# Patient Record
Sex: Male | Born: 1970 | Race: Black or African American | Hispanic: Yes | Marital: Single | Smoking: Current every day smoker
Health system: Southern US, Academic
[De-identification: ages and names within clinical notes are randomized; demographics above are authoritative.]

## PROBLEM LIST (undated history)

## (undated) DIAGNOSIS — I10 Essential (primary) hypertension: Secondary | ICD-10-CM

---

## 2014-04-19 ENCOUNTER — Emergency Department (HOSPITAL_BASED_OUTPATIENT_CLINIC_OR_DEPARTMENT_OTHER)
Admission: EM | Admit: 2014-04-19 | Discharge: 2014-04-20 | Disposition: A | Discharge status: Home / Self Care | Payer: Self-pay | Attending: Emergency Medicine | Admitting: Emergency Medicine

## 2014-04-19 ENCOUNTER — Emergency Department (HOSPITAL_BASED_OUTPATIENT_CLINIC_OR_DEPARTMENT_OTHER): Payer: Self-pay

## 2014-04-19 ENCOUNTER — Encounter (HOSPITAL_BASED_OUTPATIENT_CLINIC_OR_DEPARTMENT_OTHER): Payer: Self-pay

## 2014-04-19 DIAGNOSIS — M545 Low back pain: Secondary | ICD-10-CM | POA: Insufficient documentation

## 2014-04-19 DIAGNOSIS — M5136 Other intervertebral disc degeneration, lumbar region: Secondary | ICD-10-CM | POA: Insufficient documentation

## 2014-04-19 DIAGNOSIS — F17200 Nicotine dependence, unspecified, uncomplicated: Secondary | ICD-10-CM | POA: Insufficient documentation

## 2014-04-19 HISTORY — DX: Essential (primary) hypertension: I10

## 2014-04-19 MED ORDER — DICLOFENAC SODIUM 50 MG TABLET,DELAYED RELEASE
50.00 mg | DELAYED_RELEASE_TABLET | Freq: Two times a day (BID) | ORAL | Status: AC
Start: 2014-04-19 — End: ?

## 2014-04-19 MED ORDER — DIAZEPAM 5 MG TABLET
5.00 mg | ORAL_TABLET | Freq: Four times a day (QID) | ORAL | Status: AC | PRN
Start: 2014-04-19 — End: ?

## 2014-04-19 MED ORDER — KETOROLAC 30 MG/ML (1 ML) INJECTION SOLUTION
30.00 mg | INTRAMUSCULAR | Status: AC
Start: 2014-04-20 — End: 2014-04-19
  Administered 2014-04-19: 30 mg via INTRAVENOUS
  Filled 2014-04-19: qty 1

## 2014-04-19 MED ORDER — DIAZEPAM 5 MG/ML INJECTION SYRINGE
5.00 mg | INJECTION | INTRAMUSCULAR | Status: AC
Start: 2014-04-20 — End: 2014-04-19
  Administered 2014-04-19: 5 mg via INTRAVENOUS
  Filled 2014-04-19: qty 2

## 2014-04-19 MED ADMIN — calcium gluconate 100 mg/mL (10 %) intravenous solution: INTRAVENOUS | NDC 63323031110

## 2014-04-19 NOTE — ED Nurses Note (Signed)
Report given to Kelly RN by Kurt Azimi RN
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
## 2014-04-19 NOTE — ED Nurses Note (Signed)
CT scanner cannot take patient at this time.
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
## 2014-04-20 ENCOUNTER — Emergency Department (HOSPITAL_BASED_OUTPATIENT_CLINIC_OR_DEPARTMENT_OTHER)
Admission: EM | Admit: 2014-04-20 | Discharge: 2014-04-20 | Disposition: A | Discharge status: Home / Self Care | Payer: Self-pay | Attending: EXTERNAL | Admitting: EXTERNAL

## 2014-04-20 ENCOUNTER — Encounter (HOSPITAL_BASED_OUTPATIENT_CLINIC_OR_DEPARTMENT_OTHER): Payer: Self-pay

## 2014-04-20 DIAGNOSIS — Z760 Encounter for issue of repeat prescription: Secondary | ICD-10-CM | POA: Insufficient documentation

## 2014-04-20 MED ORDER — DIAZEPAM 5 MG TABLET
5.00 mg | ORAL_TABLET | ORAL | Status: AC
Start: 2014-04-20 — End: 2014-04-20
  Administered 2014-04-20: 5 mg via ORAL
  Filled 2014-04-20: qty 1

## 2014-04-20 MED ORDER — DICLOFENAC SODIUM 50 MG TABLET,DELAYED RELEASE
50.0000 mg | DELAYED_RELEASE_TABLET | Freq: Once | ORAL | Status: AC
Start: 2014-04-20 — End: 2014-04-20
  Administered 2014-04-20: 50 mg via ORAL
  Filled 2014-04-20: qty 1

## 2014-04-20 MED ORDER — DICLOFENAC SODIUM 75 MG TABLET,DELAYED RELEASE
75.0000 mg | DELAYED_RELEASE_TABLET | Freq: Once | ORAL | Status: DC
Start: 2014-04-20 — End: 2014-04-20
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
## 2017-07-04 ENCOUNTER — Other Ambulatory Visit: Payer: Self-pay

## 2017-07-04 ENCOUNTER — Emergency Department: Payer: Self-pay

## 2017-07-04 ENCOUNTER — Inpatient Hospital Stay
Admission: EM | Admit: 2017-07-04 | Discharge: 2017-07-05 | DRG: 287 | Disposition: A | Discharge status: Home / Self Care | Payer: Self-pay | Attending: Internal Medicine | Admitting: Internal Medicine

## 2017-07-04 DIAGNOSIS — I249 Acute ischemic heart disease, unspecified: Secondary | ICD-10-CM

## 2017-07-04 DIAGNOSIS — Z79899 Other long term (current) drug therapy: Secondary | ICD-10-CM

## 2017-07-04 DIAGNOSIS — E785 Hyperlipidemia, unspecified: Secondary | ICD-10-CM | POA: Diagnosis present

## 2017-07-04 DIAGNOSIS — I200 Unstable angina: Secondary | ICD-10-CM | POA: Diagnosis present

## 2017-07-04 DIAGNOSIS — I10 Essential (primary) hypertension: Secondary | ICD-10-CM | POA: Diagnosis present

## 2017-07-04 DIAGNOSIS — F17210 Nicotine dependence, cigarettes, uncomplicated: Secondary | ICD-10-CM | POA: Diagnosis present

## 2017-07-04 DIAGNOSIS — I700 Atherosclerosis of aorta: Secondary | ICD-10-CM | POA: Diagnosis present

## 2017-07-04 DIAGNOSIS — Z8249 Family history of ischemic heart disease and other diseases of the circulatory system: Secondary | ICD-10-CM

## 2017-07-04 DIAGNOSIS — I25110 Atherosclerotic heart disease of native coronary artery with unstable angina pectoris: Principal | ICD-10-CM | POA: Diagnosis present

## 2017-07-04 HISTORY — DX: Essential (primary) hypertension: I10

## 2017-07-04 LAB — BASIC METABOLIC PANEL
Anion gap: 8 (ref 5–15)
BUN: 14 mg/dL (ref 6–20)
CALCIUM: 9.1 mg/dL (ref 8.9–10.3)
CO2: 23 mmol/L (ref 22–32)
CREATININE: 1.06 mg/dL (ref 0.61–1.24)
Chloride: 107 mmol/L (ref 101–111)
GFR calc Af Amer: >60 mL/min (ref >60)
Glucose, Bld: 95 mg/dL (ref 65–99)
Potassium: 4.2 mmol/L (ref 3.5–5.1)
SODIUM: 138 mmol/L (ref 135–145)

## 2017-07-04 LAB — CBC
HCT: 44.5 % (ref 40.0–52.0)
Hemoglobin: 15.1 g/dL (ref 13.0–18.0)
MCH: 29.2 pg (ref 26.0–34.0)
MCHC: 33.9 g/dL (ref 32.0–36.0)
MCV: 86.2 fL (ref 80.0–100.0)
PLATELETS: 203 10*3/uL (ref 150–440)
RBC: 5.16 MIL/uL (ref 4.40–5.90)
RDW: 13.6 % (ref 11.5–14.5)
WBC: 6.6 10*3/uL (ref 3.8–10.6)

## 2017-07-04 LAB — LIPID PANEL
CHOL/HDL RATIO: 4.4 ratio
Cholesterol: 218 mg/dL — ABNORMAL HIGH (ref 0–200)
HDL: 50 mg/dL (ref >40)
LDL CALC: 148 mg/dL — AB (ref 0–99)
Triglycerides: 102 mg/dL (ref <150)
VLDL: 20 mg/dL (ref 0–40)

## 2017-07-04 LAB — PROTIME-INR
INR: 0.96
Prothrombin Time: 12.7 seconds (ref 11.4–15.2)

## 2017-07-04 LAB — TROPONIN I: Troponin I: <0.03 ng/mL (ref <0.03)

## 2017-07-04 LAB — MRSA PCR SCREENING: MRSA by PCR: NEGATIVE

## 2017-07-04 LAB — GLUCOSE, CAPILLARY: Glucose-Capillary: 92 mg/dL (ref 65–99)

## 2017-07-04 MED ORDER — ONDANSETRON HCL 4 MG PO TABS: 4.0000 mg | ORAL_TABLET | Freq: Four times a day (QID) | ORAL | Status: DC | PRN

## 2017-07-04 MED ORDER — NITROGLYCERIN IN D5W 200-5 MCG/ML-% IV SOLN
0.0000 ug/min | Freq: Once | INTRAVENOUS | Status: AC
Start: 2017-07-04 — End: 2017-07-05
  Administered 2017-07-04: 5 ug/min via INTRAVENOUS
  Filled 2017-07-04: qty 250

## 2017-07-04 MED ORDER — HEPARIN BOLUS VIA INFUSION
4000.0000 [IU] | Freq: Once | INTRAVENOUS | Status: AC
  Administered 2017-07-04: 4000 [IU] via INTRAVENOUS
  Filled 2017-07-04: qty 4000

## 2017-07-04 MED ORDER — METOPROLOL TARTRATE 25 MG PO TABS
25.0000 mg | ORAL_TABLET | Freq: Two times a day (BID) | ORAL | Status: DC
  Administered 2017-07-04 – 2017-07-05 (×2): 25 mg via ORAL
  Filled 2017-07-04 (×2): qty 1

## 2017-07-04 MED ORDER — HEPARIN (PORCINE) IN NACL 100-0.45 UNIT/ML-% IJ SOLN
1450.0000 [IU]/h | INTRAMUSCULAR | Status: DC
  Administered 2017-07-04: 1350 [IU]/h via INTRAVENOUS
  Administered 2017-07-05: 1450 [IU]/h via INTRAVENOUS
  Filled 2017-07-04 (×3): qty 250

## 2017-07-04 MED ORDER — SODIUM CHLORIDE 0.9 % IV SOLN
INTRAVENOUS | Status: DC
  Administered 2017-07-04: 20:00:00 via INTRAVENOUS

## 2017-07-04 MED ORDER — ASPIRIN 81 MG PO CHEW
81.0000 mg | CHEWABLE_TABLET | Freq: Every day | ORAL | Status: DC
  Administered 2017-07-05: 81 mg via ORAL
  Filled 2017-07-04: qty 1

## 2017-07-04 MED ORDER — HYDROCODONE-ACETAMINOPHEN 5-325 MG PO TABS
1.0000 | ORAL_TABLET | ORAL | Status: DC | PRN
  Administered 2017-07-05: 1 via ORAL
  Filled 2017-07-04: qty 1

## 2017-07-04 MED ORDER — NITROGLYCERIN 0.4 MG SL SUBL
0.4000 mg | SUBLINGUAL_TABLET | SUBLINGUAL | Status: DC | PRN
  Administered 2017-07-04 (×2): 0.4 mg via SUBLINGUAL

## 2017-07-04 MED ORDER — POLYETHYLENE GLYCOL 3350 17 G PO PACK: 17.0000 g | PACK | Freq: Every day | ORAL | Status: DC | PRN

## 2017-07-04 MED ORDER — IOHEXOL 350 MG/ML SOLN
75.0000 mL | Freq: Once | INTRAVENOUS | Status: AC | PRN
  Administered 2017-07-04: 75 mL via INTRAVENOUS

## 2017-07-04 MED ORDER — NITROGLYCERIN 0.4 MG SL SUBL
SUBLINGUAL_TABLET | SUBLINGUAL | Status: AC
  Administered 2017-07-04: 0.4 mg via SUBLINGUAL
  Filled 2017-07-04: qty 1

## 2017-07-04 MED ORDER — ACETAMINOPHEN 325 MG PO TABS
650.0000 mg | ORAL_TABLET | Freq: Four times a day (QID) | ORAL | Status: DC | PRN
  Administered 2017-07-04: 650 mg via ORAL
  Filled 2017-07-04: qty 2

## 2017-07-04 MED ORDER — ONDANSETRON HCL 4 MG/2ML IJ SOLN
4.0000 mg | Freq: Four times a day (QID) | INTRAMUSCULAR | Status: DC | PRN
  Administered 2017-07-05: 4 mg via INTRAVENOUS
  Filled 2017-07-04: qty 2

## 2017-07-04 MED ORDER — LISINOPRIL 20 MG PO TABS
20.0000 mg | ORAL_TABLET | Freq: Every day | ORAL | Status: DC
  Administered 2017-07-05: 20 mg via ORAL
  Filled 2017-07-04: qty 1

## 2017-07-04 MED ORDER — ACETAMINOPHEN 650 MG RE SUPP: 650.0000 mg | Freq: Four times a day (QID) | RECTAL | Status: DC | PRN

## 2017-07-04 NOTE — ED Notes (Signed)
Heparin verified with Corrie DandyMary, RN.  Admitted provider at bedside.
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
## 2017-07-04 NOTE — ED Notes (Signed)
Patient transported to X-ray 

## 2017-07-04 NOTE — ED Notes (Addendum)
Transport to CCU by Raquel, RN, family updated and will meet pt upstairs. CCU secretary and Tess, RN CCU aware pt in route

## 2017-07-04 NOTE — ED Notes (Signed)
C/o blurry vision EDP informed. Verbal orders to hold heparin currently. Taking to CT 2 by this RN

## 2017-07-04 NOTE — Progress Notes (Signed)
ANTICOAGULATION CONSULT NOTE - Initial Consult  Pharmacy Consult for heparin drip  Indication: chest pain/ACS  No Known Allergies  Patient Measurements: Height: 6\' 4"  (193 cm) Weight: 236 lb (107 kg) IBW/kg (Calculated) : 86.8 Heparin Dosing Weight: 107 kg   Vital Signs: Temp: 97.8 F (36.6 C) (04/08 1646) Temp Source: Oral (04/08 1646) BP: 130/100 (04/08 1730) Pulse Rate: 98 (04/08 1730)  Labs: Recent Labs    07/04/17 1649  HGB 15.1  HCT 44.5  PLT 203  CREATININE 1.06  TROPONINI <0.03    Estimated Creatinine Clearance: 115.6 mL/min (by C-G formula based on SCr of 1.06 mg/dL).   Medical History: Past Medical History:  Diagnosis Date  . Hypertension    Assessment: 47 yo male presents to ED with chest pain. ASA, SL NTG and NTG paste administered. Tn <0.03. H&H and platelets WNL.    Goal of Therapy:  Heparin level 0.3-0.7 units/ml Monitor platelets by anticoagulation protocol: Yes   Plan:  Bolus heparin 4000 units.  Start heparin drip 1350 units/hr and check first heparin level in 6 hours Continue to monitor H&H and platelets.  Will follow up on baseline PT/INR ordered  Anthony Lowe, PharmD Pharmacy Resident  07/04/2017,5:38 PM

## 2017-07-04 NOTE — ED Notes (Signed)
Report to Allison, RN

## 2017-07-04 NOTE — ED Notes (Signed)
Verbal orders to give heparin now. Awaiting from pharmacy

## 2017-07-04 NOTE — Progress Notes (Signed)
eLink Physician-Brief Progress Note Patient Name: Anthony CoderClayborn Mangen DOB: 10/24/1970 MRN: [id number]   Date of Service  07/04/2017  HPI/Events of Note  47 yo male admitted to ICU with unstable angina. Currently on Nitroglycerin and Heparin IV infusions. PCCM asked to assume care.  VSS.  eICU Interventions  No new orders.     Intervention Category Evaluation Type: New Patient Evaluation  Lenell AntuSommer,Steven Eugene 07/04/2017, 8:55 PM
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
## 2017-07-04 NOTE — ED Notes (Signed)
Report to Christina, RN

## 2017-07-04 NOTE — H&P (Signed)
[employer] - [place] at [facility]   PATIENT NAME: Anthony Lowe    MR#:  [id number]  DATE OF BIRTH:  11-Dec-1970  DATE OF ADMISSION:  07/04/2017  PRIMARY CARE PHYSICIAN: No primary care provider on file.   REQUESTING/REFERRING PHYSICIAN: dr Don Perking  CHIEF COMPLAINT:   Chest pain HISTORY OF PRESENT ILLNESS:  Aarik Blank  is a 47 y.o. male with a known history of HTN who presents to the emergency room with chief complaint of chest pain.  Patient reports that he was driving when he developed sudden onset of left-sided chest pain radiating to his arm associated with numbness and tingling of the arm as well as shortness of breath.  He also had pain to his neck.  It was associated with diaphoresis and shortness of breath.  He went to the fire department and was brought to the emergency room via EMS. Patient was placed on heparin drip and nitroglycerin drip due to chest pain.  He is currently chest pain-free.  ED physician has spoken with cardiology.  Plan is for cardiac catheterization tomorrow unless he has further chest pain overnight in which they may have to do cardiac catheterization tonight.  PAST MEDICAL HISTORY:   Past Medical History:  Diagnosis Date  . Hypertension     PAST SURGICAL HISTORY:  None  SOCIAL HISTORY:   Social History   Tobacco Use  . Smoking status: Current Every Day Smoker  Substance Use Topics  . Alcohol use: Yes    FAMILY HISTORY:  Hypertension  DRUG ALLERGIES:  No Known Allergies  REVIEW OF SYSTEMS:   Review of Systems  Constitutional: Negative.  Negative for chills, fever and malaise/fatigue.  HENT: Negative.  Negative for ear discharge, ear pain, hearing loss, nosebleeds and sore throat.   Eyes: Negative.  Negative for blurred vision and pain.  Respiratory: Positive for shortness of breath. Negative for cough, hemoptysis and wheezing.   Cardiovascular: Positive for chest pain and palpitations. Negative for leg swelling.   Gastrointestinal: Negative.  Negative for abdominal pain, blood in stool, diarrhea, nausea and vomiting.  Genitourinary: Negative.  Negative for dysuria.  Musculoskeletal: Negative.  Negative for back pain.  Skin: Negative.   Neurological: Negative for dizziness, tremors, speech change, focal weakness, seizures and headaches.  Endo/Heme/Allergies: Negative.  Does not bruise/bleed easily.  Psychiatric/Behavioral: Negative.  Negative for depression, hallucinations and suicidal ideas.    MEDICATIONS AT HOME:   Prior to Admission medications   Medication Sig Start Date End Date Taking? Authorizing Provider  lisinopril (PRINIVIL,ZESTRIL) 20 MG tablet Take 20 mg by mouth daily.   Yes [provider]      VITAL SIGNS:  Blood pressure (!) 125/105, pulse 92, temperature 97.8 F (36.6 C), temperature source Oral, resp. rate 14, height 6\' 4"  (1.93 m), weight 107 kg (236 lb), SpO2 98 %.  PHYSICAL EXAMINATION:   Physical Exam  Constitutional: He is oriented to person, place, and time and well-developed, well-nourished, and in no distress. No distress.  HENT:  Head: Normocephalic.  Eyes: No scleral icterus.  Neck: Normal range of motion. Neck supple. No JVD present. No tracheal deviation present.  Cardiovascular: Normal rate, regular rhythm and normal heart sounds. Exam reveals no gallop and no friction rub.  No murmur heard. Pulmonary/Chest: Effort normal and breath sounds normal. No respiratory distress. He has no wheezes. He has no rales. He exhibits no tenderness.  Abdominal: Soft. Bowel sounds are normal. He exhibits no distension and no mass. There is no  tenderness. There is no rebound and no guarding.  Musculoskeletal: Normal range of motion. He exhibits no edema.  Neurological: He is alert and oriented to person, place, and time.  Skin: Skin is warm. No rash noted. No erythema.  Psychiatric: Affect and judgment normal.      LABORATORY PANEL:   CBC Recent Labs  Lab  07/04/17 1649  WBC 6.6  HGB 15.1  HCT 44.5  PLT 203   ------------------------------------------------------------------------------------------------------------------  Chemistries  Recent Labs  Lab 07/04/17 1649  NA 138  K 4.2  CL 107  CO2 23  GLUCOSE 95  BUN 14  CREATININE 1.06  CALCIUM 9.1   ------------------------------------------------------------------------------------------------------------------  Cardiac Enzymes Recent Labs  Lab 07/04/17 1649  TROPONINI <0.03   ------------------------------------------------------------------------------------------------------------------  RADIOLOGY:  Dg Chest 2 View  Result Date: 07/04/2017 CLINICAL DATA:  Chest pain EXAM: CHEST - 2 VIEW COMPARISON:  None. FINDINGS: The heart size and mediastinal contours are within normal limits. Both lungs are clear. The visualized skeletal structures are unremarkable. IMPRESSION: No active cardiopulmonary disease. Electronically Signed   By: Jasmine Pang M.D.   On: 07/04/2017 17:24   Ct Angio Chest Aorta W And/or Wo Contrast  Result Date: 07/04/2017 CLINICAL DATA:  Chest pain hypertensive EXAM: CT ANGIOGRAPHY CHEST WITH CONTRAST TECHNIQUE: Multidetector CT imaging of the chest was performed using the standard protocol during bolus administration of intravenous contrast. Multiplanar CT image reconstructions and MIPs were obtained to evaluate the vascular anatomy. CONTRAST:  75mL OMNIPAQUE IOHEXOL 350 MG/ML SOLN COMPARISON:  Chest x-ray 07/04/2017 FINDINGS: Cardiovascular: Non contrasted images of the chest demonstrate no intramural hematoma. Scattered aortic atherosclerosis. No dissection seen. Mild ectasia of the ascending aorta up to 3.6 cm. No aneurysm. Heart size within normal limits. No pericardial effusion. Mediastinum/Nodes: No enlarged mediastinal, hilar, or axillary lymph nodes. Thyroid gland, trachea, and esophagus demonstrate no significant findings. Lungs/Pleura: Lungs are clear. No  pleural effusion or pneumothorax. Upper Abdomen: No acute abnormality. Possible cortical scarring midpole right kidney. Musculoskeletal: No chest wall abnormality. No acute or significant osseous findings. Review of the MIP images confirms the above findings. IMPRESSION: 1. Negative for aneurysm or aortic dissection. 2. Clear lung fields Aortic Atherosclerosis (ICD10-I70.0). Electronically Signed   By: Jasmine Pang M.D.   On: 07/04/2017 17:56    EKG:   Normal sinus rhythm heart rate 95 less than 1 mm elevation in V2 and V3 with T wave inversions in lateral leads  IMPRESSION AND PLAN:   47 year old male with history of essential hypertension and tobacco dependence who presents with unstable angina.  1.  Unstable angina Patient will be admitted to stepdown unit Continue nitroglycerin drip and heparin drip Side effects, alternatives, risks and benefits were discussed with the patient and he agrees to continue these medications. Follow telemetry and troponins Cardiology consultation for cardiac catheterization in a.m. unless patient should develop further chest pain not relieved with nitroglycerin he may go tonight for cardiac catheterization. Start aspirin and metoprolol Check lipid panel and A1c   2.  Essential hypertension: Continue lisinopril in addition to metoprolol  3.Tobacco dependence: Patient is encouraged to quit smoking. Counseling was provided for 4 minutes.   All the records are reviewed and case discussed with ED provider. Management plans discussed with the patient and he is in agreement  CODE STATUS: full  TOTAL TIME TAKING CARE OF THIS PATIENT: 43 minutes.    Geovanie Winnett M.D on 07/04/2017 at 6:06 PM  Between 7am to 6pm - Pager - [phone]  After  6pm go to [email or web address] - Social research officer, governmentpassword [email or web address] [email or web address]  [facility]  Office  [phone]  CC: Primary care physician; No primary care provider on file.

## 2017-07-05 ENCOUNTER — Encounter: Payer: Self-pay | Admitting: Physician Assistant

## 2017-07-05 ENCOUNTER — Encounter
Admission: EM | Disposition: A | Discharge status: Home / Self Care | Payer: Self-pay | Source: Home / Self Care | Attending: Internal Medicine

## 2017-07-05 DIAGNOSIS — I200 Unstable angina: Secondary | ICD-10-CM

## 2017-07-05 DIAGNOSIS — I25110 Atherosclerotic heart disease of native coronary artery with unstable angina pectoris: Principal | ICD-10-CM

## 2017-07-05 HISTORY — PX: LEFT HEART CATH AND CORONARY ANGIOGRAPHY: CATH118249

## 2017-07-05 LAB — HEMOGLOBIN A1C
Hgb A1c MFr Bld: 5.6 % (ref 4.8–5.6)
Mean Plasma Glucose: 114.02 mg/dL

## 2017-07-05 LAB — TROPONIN I
Troponin I: <0.03 ng/mL (ref <0.03)
Troponin I: <0.03 ng/mL (ref <0.03)

## 2017-07-05 LAB — HEPARIN LEVEL (UNFRACTIONATED)
HEPARIN UNFRACTIONATED: 0.27 [IU]/mL — AB (ref 0.30–0.70)
Heparin Unfractionated: 0.43 IU/mL (ref 0.30–0.70)

## 2017-07-05 SURGERY — LEFT HEART CATH AND CORONARY ANGIOGRAPHY
Anesthesia: Moderate Sedation

## 2017-07-05 MED ORDER — SODIUM CHLORIDE 0.9% FLUSH
3.0000 mL | Freq: Two times a day (BID) | INTRAVENOUS | Status: DC
  Administered 2017-07-05: 3 mL via INTRAVENOUS

## 2017-07-05 MED ORDER — SODIUM CHLORIDE 0.9 % WEIGHT BASED INFUSION: 3.0000 mL/kg/h | INTRAVENOUS | Status: DC

## 2017-07-05 MED ORDER — MIDAZOLAM HCL 2 MG/2ML IJ SOLN
INTRAMUSCULAR | Status: AC
  Filled 2017-07-05: qty 2

## 2017-07-05 MED ORDER — HEPARIN SODIUM (PORCINE) 1000 UNIT/ML IJ SOLN
INTRAMUSCULAR | Status: DC | PRN
  Administered 2017-07-05: 5000 [IU] via INTRAVENOUS

## 2017-07-05 MED ORDER — SODIUM CHLORIDE 0.9 % IV SOLN
INTRAVENOUS | Status: AC
  Administered 2017-07-05: 14:00:00 via INTRAVENOUS

## 2017-07-05 MED ORDER — SODIUM CHLORIDE 0.9% FLUSH: 3.0000 mL | INTRAVENOUS | Status: DC | PRN

## 2017-07-05 MED ORDER — ATORVASTATIN CALCIUM 80 MG PO TABS: 80.0000 mg | ORAL_TABLET | Freq: Every day | ORAL | 3 refills | Status: AC

## 2017-07-05 MED ORDER — FENTANYL CITRATE (PF) 100 MCG/2ML IJ SOLN
INTRAMUSCULAR | Status: AC
  Filled 2017-07-05: qty 2

## 2017-07-05 MED ORDER — FENTANYL CITRATE (PF) 100 MCG/2ML IJ SOLN
INTRAMUSCULAR | Status: DC | PRN
  Administered 2017-07-05: 50 ug via INTRAVENOUS

## 2017-07-05 MED ORDER — HEPARIN SODIUM (PORCINE) 1000 UNIT/ML IJ SOLN
INTRAMUSCULAR | Status: AC
  Filled 2017-07-05: qty 1

## 2017-07-05 MED ORDER — MIDAZOLAM HCL 2 MG/2ML IJ SOLN
INTRAMUSCULAR | Status: DC | PRN
  Administered 2017-07-05: 1 mg via INTRAVENOUS

## 2017-07-05 MED ORDER — IOPAMIDOL (ISOVUE-300) INJECTION 61%
INTRAVENOUS | Status: DC | PRN
  Administered 2017-07-05: 60 mL via INTRA_ARTERIAL

## 2017-07-05 MED ORDER — ENOXAPARIN SODIUM 40 MG/0.4ML ~~LOC~~ SOLN: 40.0000 mg | SUBCUTANEOUS | Status: DC

## 2017-07-05 MED ORDER — SODIUM CHLORIDE 0.9 % WEIGHT BASED INFUSION: 1.0000 mL/kg/h | INTRAVENOUS | Status: DC

## 2017-07-05 MED ORDER — SODIUM CHLORIDE 0.9 % IV SOLN: 250.0000 mL | INTRAVENOUS | Status: DC | PRN

## 2017-07-05 MED ORDER — HEPARIN BOLUS VIA INFUSION
1600.0000 [IU] | Freq: Once | INTRAVENOUS | Status: AC
Start: 2017-07-05 — End: 2017-07-05
  Administered 2017-07-05: 1600 [IU] via INTRAVENOUS
  Filled 2017-07-05: qty 1600

## 2017-07-05 MED ORDER — METOPROLOL TARTRATE 25 MG PO TABS
12.5000 mg | ORAL_TABLET | Freq: Two times a day (BID) | ORAL | Status: DC
  Administered 2017-07-05: 12.5 mg via ORAL
  Filled 2017-07-05: qty 1

## 2017-07-05 MED ORDER — VERAPAMIL HCL 2.5 MG/ML IV SOLN
INTRAVENOUS | Status: DC | PRN
  Administered 2017-07-05: 2.5 mg via INTRA_ARTERIAL

## 2017-07-05 MED ORDER — SODIUM CHLORIDE 0.9% FLUSH: 3.0000 mL | Freq: Two times a day (BID) | INTRAVENOUS | Status: DC

## 2017-07-05 MED ORDER — VERAPAMIL HCL 2.5 MG/ML IV SOLN
INTRAVENOUS | Status: AC
  Filled 2017-07-05: qty 2

## 2017-07-05 MED ORDER — METOPROLOL TARTRATE 25 MG PO TABS: 12.5000 mg | ORAL_TABLET | Freq: Two times a day (BID) | ORAL | 0 refills | Status: DC

## 2017-07-05 MED ORDER — ATORVASTATIN CALCIUM 20 MG PO TABS
80.0000 mg | ORAL_TABLET | Freq: Every day | ORAL | Status: DC
Start: 2017-07-05 — End: 2017-07-06
  Administered 2017-07-05: 80 mg via ORAL
  Filled 2017-07-05: qty 4
  Filled 2017-07-05: qty 1

## 2017-07-05 MED ORDER — HEPARIN (PORCINE) IN NACL 2-0.9 UNIT/ML-% IJ SOLN
INTRAMUSCULAR | Status: AC
Start: 2017-07-05 — End: ?
  Filled 2017-07-05: qty 500

## 2017-07-05 SURGICAL SUPPLY — 7 items
CATH INFINITI 5 FR JL3.5 (CATHETERS) ×3 IMPLANT
CATH INFINITI JR4 5F (CATHETERS) ×3 IMPLANT
DEVICE RAD TR BAND REGULAR (VASCULAR PRODUCTS) ×3 IMPLANT
KIT MANI 3VAL PERCEP (MISCELLANEOUS) ×3 IMPLANT
PACK CARDIAC CATH (CUSTOM PROCEDURE TRAY) ×3 IMPLANT
SHEATH RAIN RADIAL 21G 6FR (SHEATH) ×3 IMPLANT
WIRE ROSEN-J .035X260CM (WIRE) ×3 IMPLANT

## 2017-07-05 NOTE — Progress Notes (Signed)
Nitro drip titrated down and stopped per instructions from Dr. Okey DupreEnd. Patient tolerating well. Vitals WNL. Denies chest pain.

## 2017-07-05 NOTE — Progress Notes (Signed)
Patient given AVS at this time.  All questions answered.  Patient able to teach back to start taking Lipitor and Metoprolol and to stop lisinopril.  Will continue to monitor.

## 2017-07-05 NOTE — Progress Notes (Signed)
Attempted to removed 2cc of air from TR band at 1350. Patient began to bleed. Replaced air, may begin to remove air again at 1420.

## 2017-07-05 NOTE — Progress Notes (Signed)
ANTICOAGULATION CONSULT NOTE - Initial Consult  Pharmacy Consult for heparin drip  Indication: chest pain/ACS  No Known Allergies  Patient Measurements: Height: 6\' 4"  (193 cm) Weight: 236 lb (107 kg) IBW/kg (Calculated) : 86.8 Heparin Dosing Weight: 107 kg   Vital Signs: Temp: 98.6 F (37 C) (04/08 2000) Temp Source: Oral (04/08 2000) BP: 127/91 (04/09 0000) Pulse Rate: 82 (04/09 0000)  Labs: Recent Labs    07/04/17 1649 07/04/17 1724 07/04/17 2103 07/05/17 0032  HGB 15.1  --   --   --   HCT 44.5  --   --   --   PLT 203  --   --   --   LABPROT  --  12.7  --   --   INR  --  0.96  --   --   HEPARINUNFRC  --   --   --  0.27*  CREATININE 1.06  --   --   --   TROPONINI <0.03  --  <0.03  --     Estimated Creatinine Clearance: 115.6 mL/min (by C-G formula based on SCr of 1.06 mg/dL).   Medical History: Past Medical History:  Diagnosis Date  . Hypertension    Assessment: 47 yo male presents to ED with chest pain. ASA, SL NTG and NTG paste administered. Tn <0.03. H&H and platelets WNL.    Goal of Therapy:  Heparin level 0.3-0.7 units/ml Monitor platelets by anticoagulation protocol: Yes   Plan:  Bolus heparin 4000 units.  Start heparin drip 1350 units/hr and check first heparin level in 6 hours Continue to monitor H&H and platelets.  Will follow up on baseline PT/INR ordered  04/09 @ 0030 HL 0.27 subtherapeutic. Will rebolus w/ heparin 1600 units IV x 1 and increase rate to 1450 units/hr and will recheck @ 0500 w/ am labs.  Thomasene Rippleavid Sharyn Brilliant, PharmD, BCPS Clinical Pharmacist 07/05/2017

## 2017-07-05 NOTE — Progress Notes (Signed)
Pt discharged via wheelchair.  Family able to pick up at entrance via car.

## 2017-07-05 NOTE — H&P (View-Only) (Signed)
Cardiology Consultation:   Patient ID: Anthony Lowe; [id number]; [birth year]   Admit date: 07/04/2017 Date of Consult: 07/05/2017  Primary Care Provider: Claretta Fraise, DO Primary Cardiologist: New to [facility] - consult by End   Patient Profile:   Anthony Lowe is a 47 y.o. male with a hx of HTN, ongoing tobacco abuse, hyperlipidemia, and chronic intermittent chest pain who is being seen today for the evaluation of unstable angina at the request of Dr. Juliene Pina.  History of Present Illness:   Anthony Lowe has no previously known cardiac history.  He reports a history of long-standing intermittent chest pain that has been present for many years and typically occurs once every year or two.  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  This was associated with paresthesias involving the left arm as well as significant dizziness.  The pain in his chest felt like his prior episodes of chest pain that he has had for many years, however the dizziness was completely new for him and concerned him.  He pulled into a local fire station and was brought to [facility] for further evaluation.  No palpitations, shortness of breath, presyncope, or syncope.  He did note an isolated episode of emesis earlier in the morning on 4/8 though was chest pain-free at that time.  He reports his chest pain persisted until approximately 7 PM though did redevelop chest pain overnight.  He smokes a half pack daily and has done so for the past 30 years.  His father underwent three-vessel CABG in his mid 48s.  Upon the patient's arrival to [facility] they were found to have stable vitals. EKG showed NSR, 95 bpm, nonspecific inferior and anterior ST elevation not meeting STEMI criteria, lateral T wave inversion, CXR showed no active cardiopulmonary disease.   CTA of the aorta was negative for aneurysm or aortic dissection, clear lung fields, aortic atherosclerosis was noted.  Labs showed troponin negative x3, unremarkable CBC, unremarkable bmet, LDL 148, A1c 5.6, urine drug screen pending.  He has been placed on a nitro drip with resolution of his chest pain.  He continues to be  a heparin infusion.  Currently chest pain-free though continues to note left arm paresthesias as well as a headache.  Past Medical History:  Diagnosis Date  . Hypertension     History reviewed. No pertinent surgical history.   Home Meds: Prior to Admission medications   Medication Sig Start Date End Date Taking? Authorizing Provider  lisinopril (PRINIVIL,ZESTRIL) 20 MG tablet Take 20 mg by mouth daily.   Yes [provider]    Inpatient Medications: Scheduled Meds: . aspirin  81 mg Oral Daily  . lisinopril  20 mg Oral Daily  . metoprolol tartrate  25 mg Oral BID  . sodium chloride flush  3 mL Intravenous Q12H   Continuous Infusions: . sodium chloride 100 mL/hr at 07/05/17 0700  . sodium chloride    . [START ON 07/06/2017] sodium chloride     Followed by  . [START ON 07/06/2017] sodium chloride    . heparin 1,450 Units/hr (07/05/17 0851)   PRN Meds: sodium chloride, acetaminophen **OR** acetaminophen, HYDROcodone-acetaminophen, nitroGLYCERIN, ondansetron **OR** ondansetron (ZOFRAN) IV, polyethylene glycol, sodium chloride flush  Allergies:  No Known Allergies  Social History:  Social History   Socioeconomic History  . Marital status: Single    Spouse name: Not on file  . Number of children: Not on file  . Years of education: Not on file  . Highest education level: Not on file  Occupational History  . Not on file  Social Needs  . Financial resource strain: Not on file  . Food insecurity:    Worry: Not on file    Inability: Not on file  . Transportation needs:    Medical: Not on file    Non-medical: Not on file  Tobacco Use  . Smoking  status: Current Every Day Smoker  Substance and Sexual Activity  . Alcohol use: Yes  . Drug use: Not on file  . Sexual activity: Not on file  Lifestyle  . Physical activity:    Days per week: Not on file    Minutes per session: Not on file  . Stress: Not on file  Relationships  . Social connections:    Talks on phone: Not on file    Gets together: Not on file    Attends religious service: Not on file    Active member of club or organization: Not on file    Attends meetings of clubs or organizations: Not on file    Relationship status: Not on file  . Intimate partner violence:    Fear of current or ex partner: Not on file    Emotionally abused: Not on file    Physically abused: Not on file    Forced sexual activity: Not on file  Other Topics Concern  . Not on file  Social History Narrative  . Not on file     Family History:  Family History  Problem Relation Age of Onset  . CAD Father        a. CABG in mid 50s    ROS:  Review of Systems  Constitutional: Positive for malaise/fatigue. Negative for chills, diaphoresis, fever and weight loss.  HENT: Negative for congestion.   Eyes: Negative for discharge and redness.  Respiratory: Positive for shortness of breath. Negative for cough, hemoptysis, sputum production and wheezing.   Cardiovascular: Positive for chest pain. Negative for palpitations, orthopnea, claudication, leg swelling and PND.  Gastrointestinal: Negative for abdominal pain, blood in stool, heartburn, melena, nausea and vomiting.  Genitourinary: Negative for hematuria.  Musculoskeletal: Negative for falls and myalgias.  Skin: Negative for rash.  Neurological: Positive for dizziness, weakness and headaches. Negative for tingling, tremors, sensory change, speech change, focal weakness, seizures and loss of consciousness.  Endo/Heme/Allergies: Does not bruise/bleed easily.  Psychiatric/Behavioral: Positive for substance abuse. The patient is nervous/anxious.         Ongoing tobacco abuse  All other systems reviewed and are negative.     Physical Exam/Data:   Vitals:   07/05/17 0600 07/05/17 0700 07/05/17 0800 07/05/17 0900  BP: (!) 132/94 (!) 148/86  (!) 150/96  Pulse: 63 71 67 62  Resp: 16 12 17 17   Temp:      TempSrc:      SpO2: 96% 99% 100% 95%  Weight:      Height:        Intake/Output Summary (Last 24 hours) at 07/05/2017 1017 Last data filed at 07/05/2017 0900 Gross per 24 hour  Intake 1524.05 ml  Output 1070 ml  Net 454.05 ml   Filed Weights   07/04/17 1647 07/05/17 0500  Weight: 236 lb (107 kg) 232 lb 2.3 oz (105.3 kg)  Body mass index is 28.26 kg/m.   Physical Exam: General: Well developed, well nourished, in no acute distress. Head: Normocephalic, atraumatic, sclera non-icteric, no xanthomas, nares without discharge.  Neck: Negative for carotid bruits. JVD not elevated. Lungs: Clear bilaterally to auscultation without wheezes, rales, or rhonchi. Breathing is unlabored. Heart: RRR with S1 S2. No murmurs, rubs, or gallops appreciated. Abdomen: Soft, non-tender, non-distended with normoactive bowel sounds. No hepatomegaly. No rebound/guarding. No obvious abdominal masses. Msk:  Strength and tone appear normal for age. Extremities: No clubbing or cyanosis. No edema. Distal pedal pulses are 2+ and equal bilaterally. Neuro: Alert and oriented X 3. No facial asymmetry. No focal deficit. Moves all extremities spontaneously. Psych:  Responds to questions appropriately with a normal affect.   EKG:  The EKG was personally reviewed and demonstrates: NSR, 95 bpm, nonspecific inferior and anterior ST elevation not meeting STEMI criteria, lateral T wave inversion Telemetry:  Telemetry was personally reviewed and demonstrates: NSR  Weights: Filed Weights   07/04/17 1647 07/05/17 0500  Weight: 236 lb (107 kg) 232 lb 2.3 oz (105.3 kg)    Relevant CV Studies: Left heart cath pending Echo pending  Laboratory  Data:  Chemistry Recent Labs  Lab 07/04/17 1649  NA 138  K 4.2  CL 107  CO2 23  GLUCOSE 95  BUN 14  CREATININE 1.06  CALCIUM 9.1  GFRNONAA >60  GFRAA >60  ANIONGAP 8    No results for input(s): PROT, ALBUMIN, AST, ALT, ALKPHOS, BILITOT in the last 168 hours. Hematology Recent Labs  Lab 07/04/17 1649  WBC 6.6  RBC 5.16  HGB 15.1  HCT 44.5  MCV 86.2  MCH 29.2  MCHC 33.9  RDW 13.6  PLT 203   Cardiac Enzymes Recent Labs  Lab 07/04/17 1649 07/04/17 2103 07/05/17 0427 07/05/17 0837  TROPONINI <0.03 <0.03 <0.03 <0.03   No results for input(s): TROPIPOC in the last 168 hours.  BNPNo results for input(s): BNP, PROBNP in the last 168 hours.  DDimer No results for input(s): DDIMER in the last 168 hours.  Radiology/Studies:  Dg Chest 2 View  Result Date: 07/04/2017 IMPRESSION: No active cardiopulmonary disease. Electronically Signed   By: Jasmine PangKim  Fujinaga M.D.   On: 07/04/2017 17:24   Ct Angio Chest Aorta W And/or Wo Contrast  Result Date: 07/04/2017 IMPRESSION: 1. Negative for aneurysm or aortic dissection. 2. Clear lung fields Aortic Atherosclerosis (ICD10-I70.0). Electronically Signed   By: Jasmine PangKim  Fujinaga M.D.   On: 07/04/2017 17:56    Assessment and Plan:   1. Unstable angina: -Currently chest pain-free though continues to note left arm paresthesias -Lengthy discussion with patient and his girlfriend this morning regarding his symptoms, risk factors, and family history.  Cardiology has recommended cardiac catheterization after lengthy and detailed discussion with the patient and his girlfriend involving appropriate evaluation, risks, and benefits.  Ultimately, patient has decided to proceed with cardiac catheterization -For left heart cath this morning -Heparin drip -ASA -Echo pending -Aggressive risk factor modification including smoking cessation -Lipitor -Lopressor -Risks and benefits of cardiac catheterization have been discussed with the patient including  risks of bleeding, bruising, infection, kidney damage, stroke, heart attack, urgent need for bypass, and death. The patient understands these risks and is willing to proceed with the procedure. All questions have been answered and concerns listened to  2.  Hypertension: -Blood pressure remains elevated in the 150s systolic -Continue Lopressor and lisinopril, titrate as indicated -May need to consider addition of amlodipine  3.  Hyperlipidemia: -  Lipitor 80 mg -We will need follow-up fasting lipid and liver function in the office in approximately 6-8 weeks  4.  Ongoing tobacco abuse: -Complete cessation is advised   For questions or updates, please contact [facility] Please consult [email or web address] for contact info under Cardiology/STEMI.   Signed, Eula Listen, PA-C [facility] Pager: [phone] 07/05/2017, 10:17 AM

## 2017-07-05 NOTE — Progress Notes (Signed)
ANTICOAGULATION CONSULT NOTE - Initial Consult  Pharmacy Consult for heparin drip  Indication: chest pain/ACS  No Known Allergies  Patient Measurements: Height: 6\' 4"  (193 cm) Weight: 236 lb (107 kg) IBW/kg (Calculated) : 86.8 Heparin Dosing Weight: 107 kg   Vital Signs: Temp: 98.8 F (37.1 C) (04/09 0400) Temp Source: Oral (04/09 0400) BP: 139/98 (04/09 0446) Pulse Rate: 80 (04/09 0446)  Labs: Recent Labs    07/04/17 1649 07/04/17 1724 07/04/17 2103 07/05/17 0032 07/05/17 0427  HGB 15.1  --   --   --   --   HCT 44.5  --   --   --   --   PLT 203  --   --   --   --   LABPROT  --  12.7  --   --   --   INR  --  0.96  --   --   --   HEPARINUNFRC  --   --   --  0.27* 0.43  CREATININE 1.06  --   --   --   --   TROPONINI <0.03  --  <0.03  --  <0.03    Estimated Creatinine Clearance: 115.6 mL/min (by C-G formula based on SCr of 1.06 mg/dL).   Medical History: Past Medical History:  Diagnosis Date  . Hypertension    Assessment: 47 yo male presents to ED with chest pain. ASA, SL NTG and NTG paste administered. Tn <0.03. H&H and platelets WNL.    Goal of Therapy:  Heparin level 0.3-0.7 units/ml Monitor platelets by anticoagulation protocol: Yes   Plan:  Bolus heparin 4000 units.  Start heparin drip 1350 units/hr and check first heparin level in 6 hours Continue to monitor H&H and platelets.  Will follow up on baseline PT/INR ordered  04/09 @ 0030 HL 0.27 subtherapeutic. Will rebolus w/ heparin 1600 units IV x 1 and increase rate to 1450 units/hr and will recheck @ 0500 w/ am labs.  04/09 @ 0430 HL 0.43 therapeutic. Will continue current rate and will recheck next HL @ 1030.  Thomasene Rippleavid Neyah Ellerman, PharmD, BCPS Clinical Pharmacist 07/05/2017

## 2017-07-05 NOTE — Progress Notes (Addendum)
[employer] - [place] at [facility]                                                                                                                                                                                  Patient Demographics   Anthony Lowe, is a 47 y.o. male, DOB - [birth year], WUJ:[id number]  Admit date - 07/04/2017   Admitting Physician Adrian Saran, MD  Outpatient Primary MD for the patient is Claretta Fraise, DO   LOS - 1  Subjective: Patient seen and evaluated today On and off chest pain No shortness of breath Is an active smoker No palpitations  Review of Systems:   CONSTITUTIONAL: No documented fever. No fatigue, weakness. No weight gain, no weight loss.  EYES: No blurry or double vision.  ENT: No tinnitus. No postnasal drip. No redness of the oropharynx.  RESPIRATORY: No cough, no wheeze, no hemoptysis. No dyspnea.  CARDIOVASCULAR: Has chest pain. No orthopnea. No palpitations. No syncope.  GASTROINTESTINAL: No nausea, no vomiting or diarrhea. No abdominal pain. No melena or hematochezia.  GENITOURINARY: No dysuria or hematuria.  ENDOCRINE: No polyuria or nocturia. No heat or cold intolerance.  HEMATOLOGY: No anemia. No bruising. No bleeding.  INTEGUMENTARY: No rashes. No lesions.  MUSCULOSKELETAL: No arthritis. No swelling. No gout.  NEUROLOGIC: No numbness, tingling, or ataxia. No seizure-type activity.  PSYCHIATRIC: No anxiety. No insomnia. No ADD.    Vitals:   Vitals:   07/05/17 0700 07/05/17 0800 07/05/17 0900 07/05/17 1026  BP: (!) 148/86  (!) 150/96 (!) 140/91  Pulse: 71 67 62 (!) 53  Resp: 12 17 17 14   Temp:    98.1 F (36.7 C)  TempSrc:    Oral  SpO2: 99% 100% 95% 96%  Weight:      Height:        Wt Readings from Last 3 Encounters:  07/05/17 105.3 kg (232 lb 2.3 oz)     Intake/Output Summary (Last 24 hours) at 07/05/2017 1313 Last data filed at 07/05/2017 0900 Gross per 24 hour  Intake 1524.05 ml  Output 1070 ml  Net 454.05  ml    Physical Exam:   GENERAL: Well-built middle-aged male patient lying on the bed in no acute distress HEAD, EYES, EARS, NOSE AND THROAT: Atraumatic, normocephalic. Extraocular muscles are intact. Pupils equal and reactive to light. Sclerae anicteric. No conjunctival injection. No oro-pharyngeal erythema.  NECK: Supple. There is no jugular venous distention. No bruits, no lymphadenopathy, no thyromegaly.  HEART: Regular rate and rhythm,. No murmurs, no rubs, no clicks.  LUNGS: Clear to auscultation bilaterally. No rales or rhonchi. No wheezes.  ABDOMEN: Soft, flat, nontender, nondistended. Has good bowel sounds.  No hepatosplenomegaly appreciated.  EXTREMITIES: No evidence of any cyanosis, clubbing, or peripheral edema.  +2 pedal and radial pulses bilaterally.  NEUROLOGIC: The patient is alert, awake, and oriented x3 with no focal motor or sensory deficits appreciated bilaterally.  SKIN: Moist and warm with no rashes appreciated.  Psych: Not anxious, depressed LN: No inguinal LN enlargement    Antibiotics   Anti-infectives (From admission, onward)   None      Medications   Scheduled Meds: . aspirin  81 mg Oral Daily  . atorvastatin  80 mg Oral q1800  . lisinopril  20 mg Oral Daily  . metoprolol tartrate  25 mg Oral BID   Continuous Infusions: . sodium chloride 100 mL/hr at 07/05/17 0700  . heparin Stopped (07/05/17 1020)   PRN Meds:.acetaminophen **OR** acetaminophen, HYDROcodone-acetaminophen, nitroGLYCERIN, ondansetron **OR** ondansetron (ZOFRAN) IV, polyethylene glycol   Data Review:   Micro Results Recent Results (from the past 240 hour(s))  MRSA PCR Screening     Status: None   Collection Time: 07/04/17  8:01 PM  Result Value Ref Range Status   MRSA by PCR NEGATIVE NEGATIVE Final    Comment:        The GeneXpert MRSA Assay (FDA approved for NASAL specimens only), is one component of a comprehensive MRSA colonization surveillance program. It is not intended  to diagnose MRSA infection nor to guide or monitor treatment for MRSA infections. Performed at [facility], [address] [place], [place] [address]     Radiology Reports Dg Chest 2 View  Result Date: 07/04/2017 CLINICAL DATA:  Chest pain EXAM: CHEST - 2 VIEW COMPARISON:  None. FINDINGS: The heart size and mediastinal contours are within normal limits. Both lungs are clear. The visualized skeletal structures are unremarkable. IMPRESSION: No active cardiopulmonary disease. Electronically Signed   By: Jasmine PangKim  Fujinaga M.D.   On: 07/04/2017 17:24   Ct Angio Chest Aorta W And/or Wo Contrast  Result Date: 07/04/2017 CLINICAL DATA:  Chest pain hypertensive EXAM: CT ANGIOGRAPHY CHEST WITH CONTRAST TECHNIQUE: Multidetector CT imaging of the chest was performed using the standard protocol during bolus administration of intravenous contrast. Multiplanar CT image reconstructions and MIPs were obtained to evaluate the vascular anatomy. CONTRAST:  75mL OMNIPAQUE IOHEXOL 350 MG/ML SOLN COMPARISON:  Chest x-ray 07/04/2017 FINDINGS: Cardiovascular: Non contrasted images of the chest demonstrate no intramural hematoma. Scattered aortic atherosclerosis. No dissection seen. Mild ectasia of the ascending aorta up to 3.6 cm. No aneurysm. Heart size within normal limits. No pericardial effusion. Mediastinum/Nodes: No enlarged mediastinal, hilar, or axillary lymph nodes. Thyroid gland, trachea, and esophagus demonstrate no significant findings. Lungs/Pleura: Lungs are clear. No pleural effusion or pneumothorax. Upper Abdomen: No acute abnormality. Possible cortical scarring midpole right kidney. Musculoskeletal: No chest wall abnormality. No acute or significant osseous findings. Review of the MIP images confirms the above findings. IMPRESSION: 1. Negative for aneurysm or aortic dissection. 2. Clear lung fields Aortic Atherosclerosis (ICD10-I70.0). Electronically Signed   By: Jasmine PangKim  Fujinaga M.D.   On: 07/04/2017  17:56     CBC Recent Labs  Lab 07/04/17 1649  WBC 6.6  HGB 15.1  HCT 44.5  PLT 203  MCV 86.2  MCH 29.2  MCHC 33.9  RDW 13.6    Chemistries  Recent Labs  Lab 07/04/17 1649  NA 138  K 4.2  CL 107  CO2 23  GLUCOSE 95  BUN 14  CREATININE 1.06  CALCIUM 9.1   ------------------------------------------------------------------------------------------------------------------ estimated creatinine clearance is 114.8 mL/min (by C-G  formula based on SCr of 1.06 mg/dL). ------------------------------------------------------------------------------------------------------------------ Recent Labs    07/04/17 2103  HGBA1C 5.6   ------------------------------------------------------------------------------------------------------------------ Recent Labs    07/04/17 2103  CHOL 218*  HDL 50  LDLCALC 148*  TRIG 102  CHOLHDL 4.4   ------------------------------------------------------------------------------------------------------------------ No results for input(s): TSH, T4TOTAL, T3FREE, THYROIDAB in the last 72 hours.  Invalid input(s): FREET3 ------------------------------------------------------------------------------------------------------------------ No results for input(s): VITAMINB12, FOLATE, FERRITIN, TIBC, IRON, RETICCTPCT in the last 72 hours.  Coagulation profile Recent Labs  Lab 07/04/17 1724  INR 0.96    No results for input(s): DDIMER in the last 72 hours.  Cardiac Enzymes Recent Labs  Lab 07/04/17 2103 07/05/17 0427 07/05/17 0837  TROPONINI <0.03 <0.03 <0.03   ------------------------------------------------------------------------------------------------------------------ Invalid input(s): POCBNP    Assessment & Plan   47 year old male patient with history of tobacco abuse, hypertension presented to the hospital and admitted under our service for unstable angina.  1.  Unstable angina On nitroglycerin drip Cardiac catheterization  today Continue aspirin and statin Appreciate cardiology evaluation On heparin infusion for anticoagulation  2.Hypertension Continue beta-blocker and ACE inhibitor  3.  Tobacco cessation counseled to the patient for 6 minutes  nicotine patch offered Harmful effects of smoking explained to the patient in detail      Code Status Orders  (From admission, onward)        Start     Ordered   07/04/17 2003  Full code  Continuous     07/04/17 2003    Code Status History    This patient has a current code status but no historical code status.      Time Spent in minutes   36  Greater than 50% of time spent in care coordination and counseling patient regarding the condition and plan of care.   Ihor Austin M.D on 07/05/2017 at 1:13 PM  Between 7am to 6pm - Pager - [phone]  After 6pm go to [email or web address] - Social research officer, government  [employer]   Office  [phone]

## 2017-07-05 NOTE — Progress Notes (Signed)
Patient returned from cath at 1420.  2cc of air removed from TR band at 1420.  Pt began to bleed from site.  2cc of air replaced in TR band, will attempt to remove air again at 1500.  1500:  1cc air removed from TR band. No bleeding noticed.  1515:  1cc air removed from TR band. No bleeding noticed.  1530:  1cc air removed from TR band. No bleeding noticed.  1545:  No air removed from TR band, after air removal at 1530, pt stood at bedside to void. At 1545, blood noticed around TR band and on pillowcase.  1605:  1cc air removed from TR band. No bleeding noticed.   1630:  1cc air removed from TR band with no bleeding.  1700:  1cc air removed from TR band with no bleeding.   1730:  1cc air removed from TR band.  No bleeding noticed.  Per Dr. Okey DupreEnd, remove TR band in 30 mins and cover site with gauze and coban dressing with light pressure.   1800:  TR band removed, gauze in place with coban wrapped around with light pressure.  No bleeding from site noticed at this time.

## 2017-07-05 NOTE — Consult Note (Signed)
Cardiology Consultation:   Patient ID: Anthony Lowe; [id number]; [birth year]   Admit date: 07/04/2017 Date of Consult: 07/05/2017  Primary Care Provider: Claretta Fraise, DO Primary Cardiologist: New to [facility] - consult by End   Patient Profile:   Anthony Lowe is a 47 y.o. male with a hx of HTN, ongoing tobacco abuse, hyperlipidemia, and chronic intermittent chest pain who is being seen today for the evaluation of unstable angina at the request of Dr. Juliene Pina.  History of Present Illness:   Anthony Lowe has no previously known cardiac history.  He reports a history of long-standing intermittent chest pain that has been present for many years and typically occurs once every year or two.  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  This was associated with paresthesias involving the left arm as well as significant dizziness.  The pain in his chest felt like his prior episodes of chest pain that he has had for many years, however the dizziness was completely new for him and concerned him.  He pulled into a local fire station and was brought to [facility] for further evaluation.  No palpitations, shortness of breath, presyncope, or syncope.  He did note an isolated episode of emesis earlier in the morning on 4/8 though was chest pain-free at that time.  He reports his chest pain persisted until approximately 7 PM though did redevelop chest pain overnight.  He smokes a half pack daily and has done so for the past 30 years.  His father underwent three-vessel CABG in his mid 48s.  Upon the patient's arrival to [facility] they were found to have stable vitals. EKG showed NSR, 95 bpm, nonspecific inferior and anterior ST elevation not meeting STEMI criteria, lateral T wave inversion, CXR showed no active cardiopulmonary disease.   CTA of the aorta was negative for aneurysm or aortic dissection, clear lung fields, aortic atherosclerosis was noted.  Labs showed troponin negative x3, unremarkable CBC, unremarkable bmet, LDL 148, A1c 5.6, urine drug screen pending.  He has been placed on a nitro drip with resolution of his chest pain.  He continues to be  a heparin infusion.  Currently chest pain-free though continues to note left arm paresthesias as well as a headache.  Past Medical History:  Diagnosis Date  . Hypertension     History reviewed. No pertinent surgical history.   Home Meds: Prior to Admission medications   Medication Sig Start Date End Date Taking? Authorizing Provider  lisinopril (PRINIVIL,ZESTRIL) 20 MG tablet Take 20 mg by mouth daily.   Yes [provider]    Inpatient Medications: Scheduled Meds: . aspirin  81 mg Oral Daily  . lisinopril  20 mg Oral Daily  . metoprolol tartrate  25 mg Oral BID  . sodium chloride flush  3 mL Intravenous Q12H   Continuous Infusions: . sodium chloride 100 mL/hr at 07/05/17 0700  . sodium chloride    . [START ON 07/06/2017] sodium chloride     Followed by  . [START ON 07/06/2017] sodium chloride    . heparin 1,450 Units/hr (07/05/17 0851)   PRN Meds: sodium chloride, acetaminophen **OR** acetaminophen, HYDROcodone-acetaminophen, nitroGLYCERIN, ondansetron **OR** ondansetron (ZOFRAN) IV, polyethylene glycol, sodium chloride flush  Allergies:  No Known Allergies  Social History:  Social History   Socioeconomic History  . Marital status: Single    Spouse name: Not on file  . Number of children: Not on file  . Years of education: Not on file  . Highest education level: Not on file  Occupational History  . Not on file  Social Needs  . Financial resource strain: Not on file  . Food insecurity:    Worry: Not on file    Inability: Not on file  . Transportation needs:    Medical: Not on file    Non-medical: Not on file  Tobacco Use  . Smoking  status: Current Every Day Smoker  Substance and Sexual Activity  . Alcohol use: Yes  . Drug use: Not on file  . Sexual activity: Not on file  Lifestyle  . Physical activity:    Days per week: Not on file    Minutes per session: Not on file  . Stress: Not on file  Relationships  . Social connections:    Talks on phone: Not on file    Gets together: Not on file    Attends religious service: Not on file    Active member of club or organization: Not on file    Attends meetings of clubs or organizations: Not on file    Relationship status: Not on file  . Intimate partner violence:    Fear of current or ex partner: Not on file    Emotionally abused: Not on file    Physically abused: Not on file    Forced sexual activity: Not on file  Other Topics Concern  . Not on file  Social History Narrative  . Not on file     Family History:  Family History  Problem Relation Age of Onset  . CAD Father        a. CABG in mid 50s    ROS:  Review of Systems  Constitutional: Positive for malaise/fatigue. Negative for chills, diaphoresis, fever and weight loss.  HENT: Negative for congestion.   Eyes: Negative for discharge and redness.  Respiratory: Positive for shortness of breath. Negative for cough, hemoptysis, sputum production and wheezing.   Cardiovascular: Positive for chest pain. Negative for palpitations, orthopnea, claudication, leg swelling and PND.  Gastrointestinal: Negative for abdominal pain, blood in stool, heartburn, melena, nausea and vomiting.  Genitourinary: Negative for hematuria.  Musculoskeletal: Negative for falls and myalgias.  Skin: Negative for rash.  Neurological: Positive for dizziness, weakness and headaches. Negative for tingling, tremors, sensory change, speech change, focal weakness, seizures and loss of consciousness.  Endo/Heme/Allergies: Does not bruise/bleed easily.  Psychiatric/Behavioral: Positive for substance abuse. The patient is nervous/anxious.         Ongoing tobacco abuse  All other systems reviewed and are negative.     Physical Exam/Data:   Vitals:   07/05/17 0600 07/05/17 0700 07/05/17 0800 07/05/17 0900  BP: (!) 132/94 (!) 148/86  (!) 150/96  Pulse: 63 71 67 62  Resp: 16 12 17 17   Temp:      TempSrc:      SpO2: 96% 99% 100% 95%  Weight:      Height:        Intake/Output Summary (Last 24 hours) at 07/05/2017 1017 Last data filed at 07/05/2017 0900 Gross per 24 hour  Intake 1524.05 ml  Output 1070 ml  Net 454.05 ml   Filed Weights   07/04/17 1647 07/05/17 0500  Weight: 236 lb (107 kg) 232 lb 2.3 oz (105.3 kg)  Body mass index is 28.26 kg/m.   Physical Exam: General: Well developed, well nourished, in no acute distress. Head: Normocephalic, atraumatic, sclera non-icteric, no xanthomas, nares without discharge.  Neck: Negative for carotid bruits. JVD not elevated. Lungs: Clear bilaterally to auscultation without wheezes, rales, or rhonchi. Breathing is unlabored. Heart: RRR with S1 S2. No murmurs, rubs, or gallops appreciated. Abdomen: Soft, non-tender, non-distended with normoactive bowel sounds. No hepatomegaly. No rebound/guarding. No obvious abdominal masses. Msk:  Strength and tone appear normal for age. Extremities: No clubbing or cyanosis. No edema. Distal pedal pulses are 2+ and equal bilaterally. Neuro: Alert and oriented X 3. No facial asymmetry. No focal deficit. Moves all extremities spontaneously. Psych:  Responds to questions appropriately with a normal affect.   EKG:  The EKG was personally reviewed and demonstrates: NSR, 95 bpm, nonspecific inferior and anterior ST elevation not meeting STEMI criteria, lateral T wave inversion Telemetry:  Telemetry was personally reviewed and demonstrates: NSR  Weights: Filed Weights   07/04/17 1647 07/05/17 0500  Weight: 236 lb (107 kg) 232 lb 2.3 oz (105.3 kg)    Relevant CV Studies: Left heart cath pending Echo pending  Laboratory  Data:  Chemistry Recent Labs  Lab 07/04/17 1649  NA 138  K 4.2  CL 107  CO2 23  GLUCOSE 95  BUN 14  CREATININE 1.06  CALCIUM 9.1  GFRNONAA >60  GFRAA >60  ANIONGAP 8    No results for input(s): PROT, ALBUMIN, AST, ALT, ALKPHOS, BILITOT in the last 168 hours. Hematology Recent Labs  Lab 07/04/17 1649  WBC 6.6  RBC 5.16  HGB 15.1  HCT 44.5  MCV 86.2  MCH 29.2  MCHC 33.9  RDW 13.6  PLT 203   Cardiac Enzymes Recent Labs  Lab 07/04/17 1649 07/04/17 2103 07/05/17 0427 07/05/17 0837  TROPONINI <0.03 <0.03 <0.03 <0.03   No results for input(s): TROPIPOC in the last 168 hours.  BNPNo results for input(s): BNP, PROBNP in the last 168 hours.  DDimer No results for input(s): DDIMER in the last 168 hours.  Radiology/Studies:  Dg Chest 2 View  Result Date: 07/04/2017 IMPRESSION: No active cardiopulmonary disease. Electronically Signed   By: Jasmine PangKim  Fujinaga M.D.   On: 07/04/2017 17:24   Ct Angio Chest Aorta W And/or Wo Contrast  Result Date: 07/04/2017 IMPRESSION: 1. Negative for aneurysm or aortic dissection. 2. Clear lung fields Aortic Atherosclerosis (ICD10-I70.0). Electronically Signed   By: Jasmine PangKim  Fujinaga M.D.   On: 07/04/2017 17:56    Assessment and Plan:   1. Unstable angina: -Currently chest pain-free though continues to note left arm paresthesias -Lengthy discussion with patient and his girlfriend this morning regarding his symptoms, risk factors, and family history.  Cardiology has recommended cardiac catheterization after lengthy and detailed discussion with the patient and his girlfriend involving appropriate evaluation, risks, and benefits.  Ultimately, patient has decided to proceed with cardiac catheterization -For left heart cath this morning -Heparin drip -ASA -Echo pending -Aggressive risk factor modification including smoking cessation -Lipitor -Lopressor -Risks and benefits of cardiac catheterization have been discussed with the patient including  risks of bleeding, bruising, infection, kidney damage, stroke, heart attack, urgent need for bypass, and death. The patient understands these risks and is willing to proceed with the procedure. All questions have been answered and concerns listened to  2.  Hypertension: -Blood pressure remains elevated in the 150s systolic -Continue Lopressor and lisinopril, titrate as indicated -May need to consider addition of amlodipine  3.  Hyperlipidemia: -  Lipitor 80 mg -We will need follow-up fasting lipid and liver function in the office in approximately 6-8 weeks  4.  Ongoing tobacco abuse: -Complete cessation is advised   For questions or updates, please contact [facility] Please consult [email or web address] for contact info under Cardiology/STEMI.   Signed, Eula Listen, PA-C [facility] Pager: [phone] 07/05/2017, 10:17 AM

## 2017-07-05 NOTE — Discharge Summary (Signed)
Physician Discharge Summary  Patient ID: Anthony Lowe MRN: [id number] DOB/AGE: 47/06/1970 47 y.o.  Admit date: 07/04/2017 Discharge date: 07/05/2017  Admission Diagnoses:CHEST PAIN  Discharge Diagnoses:  Active Problems:   Unstable angina [facility](HCC)   Discharged Condition:STABLE  Hospital Course: ADMITTED FOR CHEST PAIN TREATED WITH NITRO DRIP AND HEPARIN INFUSION CARDIOLOGY CONSULTED CE NEG x 3 CARDIOLOGY -s/p cath minimal CAD only Lipitor needed Can d/c home today  Consults: Cardiology  Significant Diagnostic Studies:Cardicac Cath  See cath reports   Discharge Exam: Blood pressure (!) 167/99, pulse (!) 56, temperature 97.8 F (36.6 C), temperature source Oral, resp. rate (!) 21, height 6\' 4"  (1.93 m), weight 232 lb 2.3 oz (105.3 kg), SpO2 96 %.  Disposition: Home   Patient Needs to follow up with Dr END CARDIOLOGY AND FOLLOW UP PCP DISHCHARGE MEDS LIPITOR 80 mg daily Lopressor 50 mg daily  Signed: Erin FullingKurian Chantay Whitelock 07/05/2017, 8:17 PM

## 2017-07-05 NOTE — Progress Notes (Signed)
Follow up chest pain  Patient admitted for NSTEMI/US  On nitro drip/heparin infusion  Follow up cardiology recs Plan for cath SD monitoring and supportive care   Lucie LeatherKurian David Damarie Schoolfield, M.D.  [facility]  Medical Director [facility] Medical Director [facility] Cardio-Pulmonary Department

## 2017-07-05 NOTE — Interval H&P Note (Signed)
History and Physical Interval Note:  07/05/2017 12:29 PM  Anthony Lowe  has presented today for cardiac catheterization, with the diagnosis of unstable angina. The various methods of treatment have been discussed with the patient and family. After consideration of risks, benefits and other options for treatment, the patient has consented to  Procedure(s): LEFT HEART CATH AND CORONARY ANGIOGRAPHY (N/A) as a surgical intervention .  The patient's history has been reviewed, patient examined, no change in status, stable for surgery.  I have reviewed the patient's chart and labs.  Questions were answered to the patient's satisfaction.    Cath Lab Visit (complete for each Cath Lab visit)  Clinical Evaluation Leading to the Procedure:   ACS: Yes.    Non-ACS:  N/A   [name]

## 2017-07-06 ENCOUNTER — Encounter: Payer: Self-pay | Admitting: Internal Medicine

## 2017-07-11 ENCOUNTER — Telehealth: Payer: Self-pay

## 2017-07-11 ENCOUNTER — Ambulatory Visit: Payer: Self-pay

## 2017-07-11 NOTE — Telephone Encounter (Signed)
Flagged on EMMI report for not having a follow up scheduled and not taking meds.  Called and spoke with paitent, who reported he had just received a call from someone at the hospital to address his medication assistance.  He does not have a PCP.  Went over [facility] services and patient interested in receiving an application.  Went over eligibility requirements.  Confirmed address to send application and hours to.  He mentioned he [facility].  I thanked him for his feedback and his time.  Also made him aware that he may receive one more final call checking on him.

## 2017-07-11 NOTE — Telephone Encounter (Signed)
EMMI Follow-up: Questions on report about taking meds and follow-up appt.  Talked with Mr. Anthony Lowe and he said he got his BP medication filled but the cholesterol medication was over $200.00 and he could not afford that one.  Wondered if there was a generic medication he could take instead.  Medicaid pending.  Talked with Marylene LandAngela and she suggested Medication Mgmt.  [place] and [place]-up with them.  No follow-up appointments listed at this time. Said he received really good care here and thanked me for my assistance. No other needs at this time.

## 2018-05-16 ENCOUNTER — Encounter: Payer: Self-pay | Admitting: Emergency Medicine

## 2018-05-16 ENCOUNTER — Other Ambulatory Visit: Payer: Self-pay

## 2018-05-16 ENCOUNTER — Ambulatory Visit
Admission: EM | Admit: 2018-05-16 | Discharge: 2018-05-16 | Disposition: A | Discharge status: Home / Self Care | Payer: Self-pay | Attending: Family Medicine | Admitting: Family Medicine

## 2018-05-16 DIAGNOSIS — R112 Nausea with vomiting, unspecified: Secondary | ICD-10-CM

## 2018-05-16 DIAGNOSIS — R197 Diarrhea, unspecified: Secondary | ICD-10-CM

## 2018-05-16 DIAGNOSIS — I10 Essential (primary) hypertension: Secondary | ICD-10-CM

## 2018-05-16 MED ORDER — ONDANSETRON 8 MG PO TBDP: 8.0000 mg | ORAL_TABLET | Freq: Two times a day (BID) | ORAL | 0 refills | Status: AC

## 2018-05-16 MED ORDER — ONDANSETRON 8 MG PO TBDP
8.0000 mg | ORAL_TABLET | Freq: Once | ORAL | Status: AC
  Administered 2018-05-16: 8 mg via ORAL

## 2018-05-16 MED ORDER — ONDANSETRON 8 MG PO TBDP: 8.0000 mg | ORAL_TABLET | Freq: Once | ORAL | Status: DC

## 2018-05-16 NOTE — ED Provider Notes (Signed)
[facility]    CSN: [id number] Arrival date & time: 05/16/18  1625     History   Chief Complaint Chief Complaint  Patient presents with  . Abdominal Pain  . Diarrhea  . Emesis    HPI Anthony Lowe is a 48 y.o. male.   HPI  -year-old male presents with nausea vomiting and diarrhea with abdominal cramping that started yesterday.  He ate a local restaurant "skids" pork chop and about 2 hours later started to have severe vomiting.  Has vomited twice but most of his problem now is that of diarrhea which seems to be very frequent.  It is watery without blood.  He has periumbilical cramping.  Does state that today is better than it was yesterday.  He has not vomited recently but is nauseated.  Continues to have the diarrhea.  He denies any fever or chills.         Past Medical History:  Diagnosis Date  . Hypertension     Patient Active Problem List   Diagnosis Date Noted  . Unstable angina (HCC) 07/04/2017    Past Surgical History:  Procedure Laterality Date  . LEFT HEART CATH AND CORONARY ANGIOGRAPHY N/A 07/05/2017   Procedure: LEFT HEART CATH AND CORONARY ANGIOGRAPHY;  Surgeon: Yvonne KendallEnd, Christopher, MD;  Location: [facility];  Service: Cardiovascular;  Laterality: N/A;       Home Medications    Prior to Admission medications   Medication Sig Start Date End Date Taking? Authorizing Provider  atorvastatin (LIPITOR) 80 MG tablet Take 1 tablet (80 mg total) by mouth daily at 6 PM. 07/06/17  Yes Kasa, Kurian, MD  lisinopril (PRINIVIL,ZESTRIL) 40 MG tablet Take by mouth. 12/28/16  Yes [provider]  ondansetron (ZOFRAN ODT) 8 MG disintegrating tablet Take 1 tablet (8 mg total) by mouth 2 (two) times daily. 05/16/18   Lutricia Feiloemer, William P, PA-C    Family History Family History  Problem Relation Age of Onset  . CAD Father        a. CABG in mid 1150s    Social History Social History   Tobacco Use  . Smoking status: Current Every Day Smoker      Types: Cigarettes  . Smokeless tobacco: Never Used  Substance Use Topics  . Alcohol use: Yes  . Drug use: Not on file     Allergies   Penicillins   Review of Systems Review of Systems  Constitutional: Positive for activity change and appetite change. Negative for chills, fatigue and fever.  Gastrointestinal: Positive for abdominal pain, diarrhea, nausea and vomiting. Negative for blood in stool and constipation.  Neurological: Positive for dizziness.  All other systems reviewed and are negative.    Physical Exam Triage Vital Signs ED Triage Vitals  Enc Vitals Group     BP 05/16/18 1638 (!) 145/110     Pulse Rate 05/16/18 1638 90     Resp 05/16/18 1638 16     Temp 05/16/18 1638 98.3 F (36.8 C)     Temp Source 05/16/18 1638 Oral     SpO2 05/16/18 1638 100 %     Weight 05/16/18 1636 238 lb (108 kg)     Height 05/16/18 1636 6\' 4"  (1.93 m)     Head Circumference --      Peak Flow --      Pain Score 05/16/18 1635 8     Pain Loc --      Pain Edu? --  Excl. in GC? --    No data found.  Updated Vital Signs BP (!) 145/110 (BP Location: Left Arm)   Pulse 90   Temp 98.3 F (36.8 C) (Oral)   Resp 16   Ht 6\' 4"  (1.93 m)   Wt 238 lb (108 kg)   SpO2 100%   BMI 28.97 kg/m   Visual Acuity Right Eye Distance:   Left Eye Distance:   Bilateral Distance:    Right Eye Near:   Left Eye Near:    Bilateral Near:     Physical Exam Vitals signs reviewed.  Constitutional:      General: He is not in acute distress.    Appearance: He is well-developed and normal weight. He is not ill-appearing, toxic-appearing or diaphoretic.  HENT:     Head: Normocephalic.     Mouth/Throat:     Mouth: Mucous membranes are moist.     Pharynx: Oropharynx is clear.  Pulmonary:     Effort: Pulmonary effort is normal.     Breath sounds: Normal breath sounds.  Abdominal:     General: Abdomen is flat. Bowel sounds are increased. There is no distension or abdominal bruit. There are no  signs of injury.     Palpations: Abdomen is soft.     Tenderness: There is abdominal tenderness in the periumbilical area. There is no guarding or rebound.  Skin:    General: Skin is warm and dry.  Neurological:     General: No focal deficit present.     Mental Status: He is alert and oriented to person, place, and time.  Psychiatric:        Mood and Affect: Mood normal.        Behavior: Behavior normal.      UC Treatments / Results  Labs (all labs ordered are listed, but only abnormal results are displayed) Labs Reviewed - No data to display  EKG None  Radiology No results found.  Procedures Procedures (including critical care time)  Medications Ordered in UC Medications  ondansetron (ZOFRAN-ODT) disintegrating tablet 8 mg (8 mg Oral Given 05/16/18 1708)    Initial Impression / Assessment and Plan / UC Course  I have reviewed the triage vital signs and the nursing notes.  Pertinent labs & imaging results that were available during my care of the patient were reviewed by me and considered in my medical decision making (see chart for details).   Patient has nausea vomiting diarrhea likely from foodborne illness.  Present time he states that he is improving over yesterday.  He continues to have diarrhea and not vomiting.  He does have nausea.  Dizziness is likely from fluid loss.  Told him that he must increase his intake of fluids to keep his urine clear to faint yellow.  We will provide him with Zofran for nausea.  He is worsening I recommended he go to the emergency room.   Final Clinical Impressions(s) / UC Diagnoses   Final diagnoses:  Nausea vomiting and diarrhea     Discharge Instructions     Continue fluids.  Keep your urine the same color as water or very pale yellow.  If you worsen go to the emergency room    ED Prescriptions    Medication Sig Dispense Auth. Provider   ondansetron (ZOFRAN ODT) 8 MG disintegrating tablet Take 1 tablet (8 mg total) by  mouth 2 (two) times daily. 6 tablet Lutricia Feil, PA-C     Controlled Substance Prescriptions [place] Controlled  Substance Registry consulted? Not Applicable   Lutricia Feil, PA-C 05/16/18 1732

## 2018-05-16 NOTE — Discharge Instructions (Signed)
Continue fluids.  Keep your urine the same color as water or very pale yellow.  If you worsen go to the emergency room

## 2018-05-16 NOTE — ED Triage Notes (Signed)
Patient c/o N/V/D and abdominal pain that started yesterday.

## 2019-01-13 ENCOUNTER — Encounter: Payer: Self-pay | Admitting: Emergency Medicine

## 2019-01-13 ENCOUNTER — Ambulatory Visit
Admission: EM | Admit: 2019-01-13 | Discharge: 2019-01-13 | Disposition: A | Discharge status: Home / Self Care | Payer: Self-pay | Attending: Family Medicine | Admitting: Family Medicine

## 2019-01-13 ENCOUNTER — Other Ambulatory Visit: Payer: Self-pay

## 2019-01-13 DIAGNOSIS — I10 Essential (primary) hypertension: Secondary | ICD-10-CM

## 2019-01-13 DIAGNOSIS — N390 Urinary tract infection, site not specified: Secondary | ICD-10-CM

## 2019-01-13 LAB — URINALYSIS, COMPLETE (UACMP) WITH MICROSCOPIC
Bilirubin Urine: NEGATIVE
Glucose, UA: NEGATIVE mg/dL
Ketones, ur: NEGATIVE mg/dL
Nitrite: POSITIVE — AB
Protein, ur: 30 mg/dL — AB
Specific Gravity, Urine: 1.02 (ref 1.005–1.030)
pH: 7.5 (ref 5.0–8.0)

## 2019-01-13 MED ORDER — LISINOPRIL 20 MG PO TABS: 20.0000 mg | ORAL_TABLET | Freq: Every day | ORAL | 0 refills | Status: AC

## 2019-01-13 MED ORDER — SULFAMETHOXAZOLE-TRIMETHOPRIM 800-160 MG PO TABS: 1.0000 | ORAL_TABLET | Freq: Two times a day (BID) | ORAL | 0 refills | Status: AC

## 2019-01-13 NOTE — ED Triage Notes (Signed)
Patient states that he noticed blood in his urine for about a week.  Patient states that he noticed some blood in his underwear this morning.  Patient denies fever.  Patient denies any pain.

## 2019-01-13 NOTE — ED Provider Notes (Signed)
[facility]    CSN: [id number] Arrival date & time: 01/13/19  1307      History   Chief Complaint Chief Complaint  Patient presents with  . Hematuria    HPI Anthony Lowe is a 47 y.o. male.   48 yo male with a c/o blood in his urine for about 1 week associated with mild discomfort. Denies any penile discharge, fevers, chills, vomiting, abdominal pain, back pain.   Patient states he's been out of his blood pressure medication for about 4 months.    Hematuria    Past Medical History:  Diagnosis Date  . Hypertension     Patient Active Problem List   Diagnosis Date Noted  . Unstable angina (HCC) 07/04/2017    Past Surgical History:  Procedure Laterality Date  . LEFT HEART CATH AND CORONARY ANGIOGRAPHY N/A 07/05/2017   Procedure: LEFT HEART CATH AND CORONARY ANGIOGRAPHY;  Surgeon: Yvonne Kendall, MD;  Location: [facility];  Service: Cardiovascular;  Laterality: N/A;       Home Medications    Prior to Admission medications   Medication Sig Start Date End Date Taking? Authorizing Provider  atorvastatin (LIPITOR) 80 MG tablet Take 1 tablet (80 mg total) by mouth daily at 6 PM. 07/06/17   Erin Fulling, MD  lisinopril (ZESTRIL) 20 MG tablet Take 1 tablet (20 mg total) by mouth daily. 01/13/19   Payton Mccallum, MD  ondansetron (ZOFRAN ODT) 8 MG disintegrating tablet Take 1 tablet (8 mg total) by mouth 2 (two) times daily. 05/16/18   Lutricia Feil, PA-C  sulfamethoxazole-trimethoprim (BACTRIM DS) 800-160 MG tablet Take 1 tablet by mouth 2 (two) times daily. 01/13/19   Payton Mccallum, MD    Family History Family History  Problem Relation Age of Onset  . CAD Father        a. CABG in mid 76s    Social History Social History   Tobacco Use  . Smoking status: Current Every Day Smoker    Types: Cigarettes  . Smokeless tobacco: Never Used  Substance Use Topics  . Alcohol use: Yes  . Drug use: Not on file     Allergies   Penicillins    Review of Systems Review of Systems  Genitourinary: Positive for hematuria.     Physical Exam Triage Vital Signs ED Triage Vitals  Enc Vitals Group     BP 01/13/19 1330 (!) 180/120     Pulse Rate 01/13/19 1330 77     Resp 01/13/19 1330 16     Temp 01/13/19 1330 98.2 F (36.8 C)     Temp Source 01/13/19 1330 Oral     SpO2 01/13/19 1330 99 %     Weight 01/13/19 1327 236 lb (107 kg)     Height 01/13/19 1327 6\' 4"  (1.93 m)     Head Circumference --      Peak Flow --      Pain Score 01/13/19 1327 0     Pain Loc --      Pain Edu? --      Excl. in GC? --    No data found.  Updated Vital Signs BP (!) 180/120 (BP Location: Right Arm) Comment: Patient states that he has been out of his BP medicine for four months  Pulse 77   Temp 98.2 F (36.8 C) (Oral)   Resp 16   Ht 6\' 4"  (1.93 m)   Wt 107 kg   SpO2 99%   BMI 28.73 kg/m  Visual Acuity Right Eye Distance:   Left Eye Distance:   Bilateral Distance:    Right Eye Near:   Left Eye Near:    Bilateral Near:     Physical Exam Vitals signs and nursing note reviewed.  Constitutional:      General: He is not in acute distress.    Appearance: He is not toxic-appearing or diaphoretic.  Cardiovascular:     Rate and Rhythm: Normal rate.  Abdominal:     General: There is no distension.     Palpations: Abdomen is soft.      UC Treatments / Results  Labs (all labs ordered are listed, but only abnormal results are displayed) Labs Reviewed  URINALYSIS, COMPLETE (UACMP) WITH MICROSCOPIC - Abnormal; Notable for the following components:      Result Value   APPearance CLOUDY (*)    Hgb urine dipstick SMALL (*)    Protein, ur 30 (*)    Nitrite POSITIVE (*)    Leukocytes,Ua SMALL (*)    Bacteria, UA MANY (*)    All other components within normal limits  URINE CULTURE    EKG   Radiology No results found.  Procedures Procedures (including critical care time)  Medications Ordered in UC Medications - No data  to display  Initial Impression / Assessment and Plan / UC Course  I have reviewed the triage vital signs and the nursing notes.  Pertinent labs & imaging results that were available during my care of the patient were reviewed by me and considered in my medical decision making (see chart for details).      Final Clinical Impressions(s) / UC Diagnoses   Final diagnoses:  Lower urinary tract infectious disease  Hypertension, unspecified type     Discharge Instructions     Increase water intake Follow up with your primary care doctor for recheck blood pressure and refill medication    ED Prescriptions    Medication Sig Dispense Auth. Provider   sulfamethoxazole-trimethoprim (BACTRIM DS) 800-160 MG tablet Take 1 tablet by mouth 2 (two) times daily. 14 tablet Leviticus Harton, Linward Foster, MD   lisinopril (ZESTRIL) 20 MG tablet Take 1 tablet (20 mg total) by mouth daily. 15 tablet Norval Gable, MD      1. Lab results and diagnosis reviewed with patient 2. rx as per orders above; reviewed possible side effects, interactions, risks and benefits  3. Recommend supportive treatment as above 4. Follow-up prn if symptoms worsen or don't improve  PDMP not reviewed this encounter.   Norval Gable, MD 01/13/19 [phone]

## 2019-01-13 NOTE — Discharge Instructions (Signed)
Increase water intake Follow up with your primary care doctor for recheck blood pressure and refill medication

## 2019-01-15 ENCOUNTER — Telehealth (HOSPITAL_COMMUNITY): Payer: Self-pay | Admitting: Emergency Medicine

## 2019-01-15 LAB — URINE CULTURE: Culture: >=100000 — AB

## 2019-01-15 NOTE — Telephone Encounter (Signed)
Reviewed chart with Dr. Meda Coffee, pt given bactrim, per MD should cover this. Attempted to reach patient. No answer at this time.

## 2019-08-04 IMAGING — CT CT ANGIO CHEST
3 of 7 series · 19 of 46 positions shown · IV contrast (APPLIED)
Comparison: Chest x-ray 07/04/2017

CLINICAL DATA: Chest pain hypertensive

EXAM:
CT ANGIOGRAPHY CHEST WITH CONTRAST
TECHNIQUE: Multidetector CT imaging of the chest was performed using the
standard protocol during bolus administration of intravenous
contrast. Multiplanar CT image reconstructions and MIPs were
obtained to evaluate the vascular anatomy.
CONTRAST:  75mL OMNIPAQUE IOHEXOL 350 MG/ML SOLN

[Series 6: axial arterial · axial · arterial · 0.65mm/px · z∈[-634,-346]mm · 9 of 121 slices shown]
[im 13/121  lung]
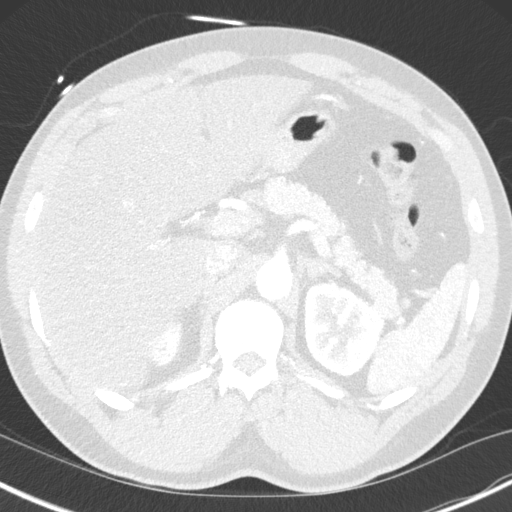
[im 25/121  soft-tissue]
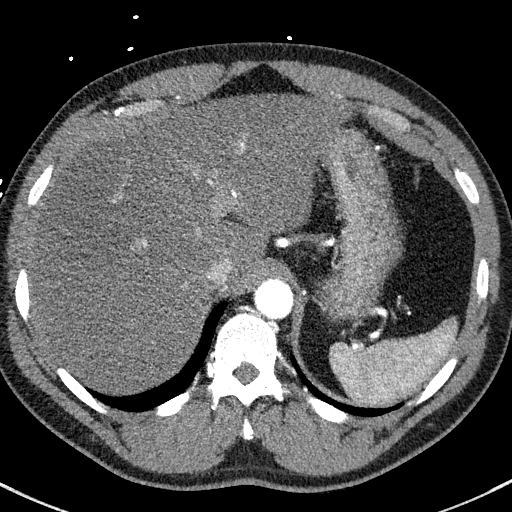
[im 37/121  lung]
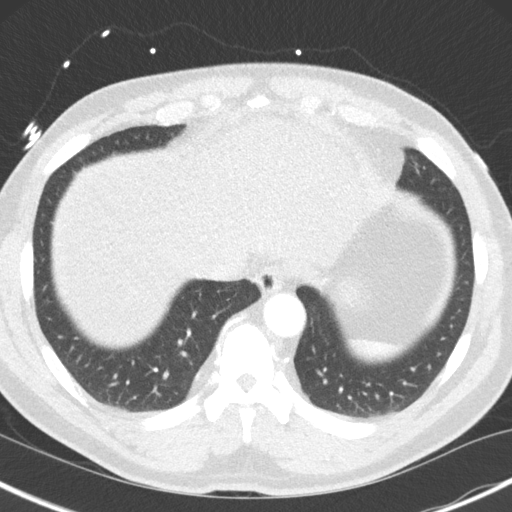
[im 49/121  soft-tissue]
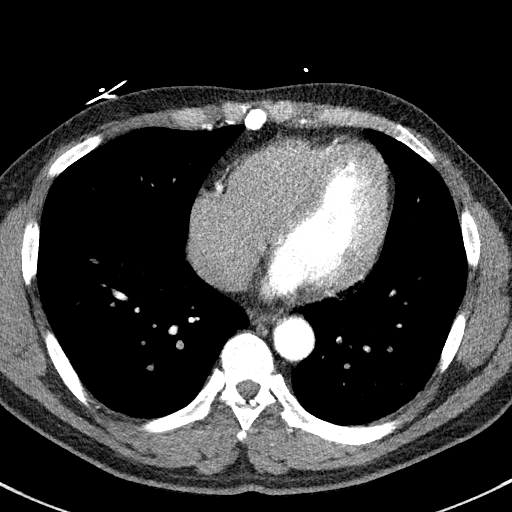
[im 61/121  lung]
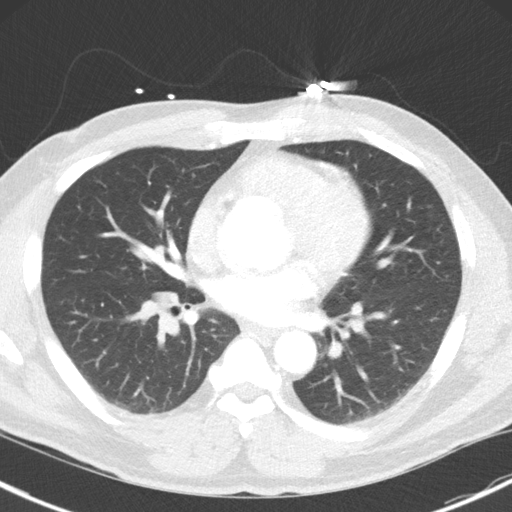
[im 73/121  soft-tissue]
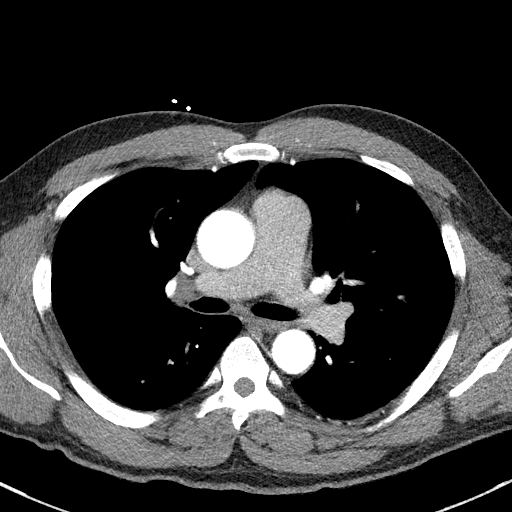
[im 85/121  lung]
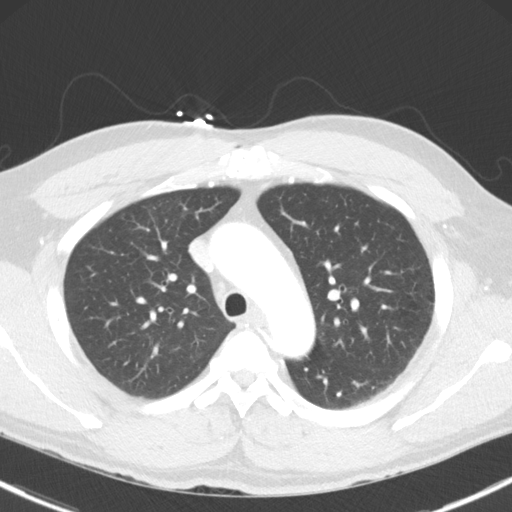
[im 97/121  soft-tissue]
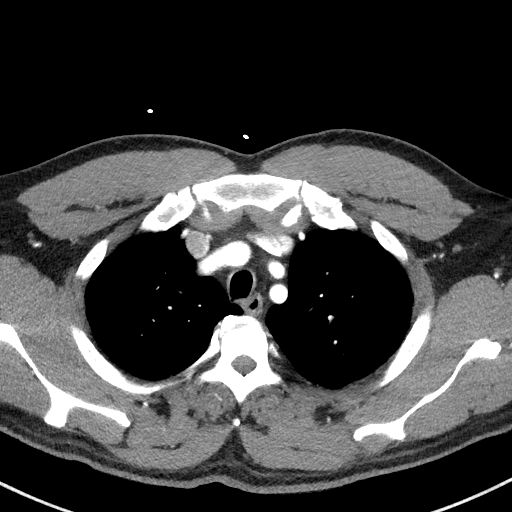
[im 109/121  lung]
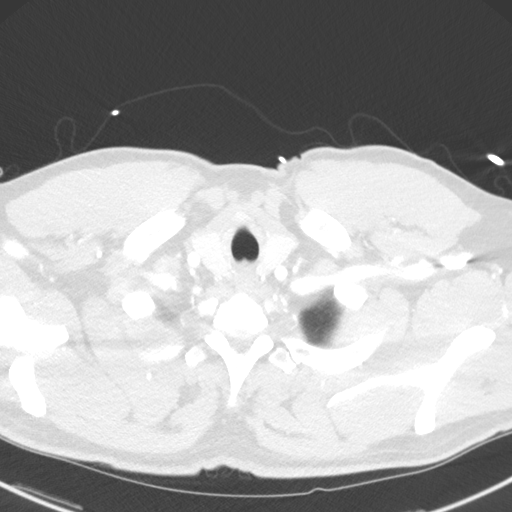

[Series 7: lung · axial · 0.65mm/px · z∈[-624,-384]mm · 7 of 170 slices shown]
[im 13/170  soft-tissue]
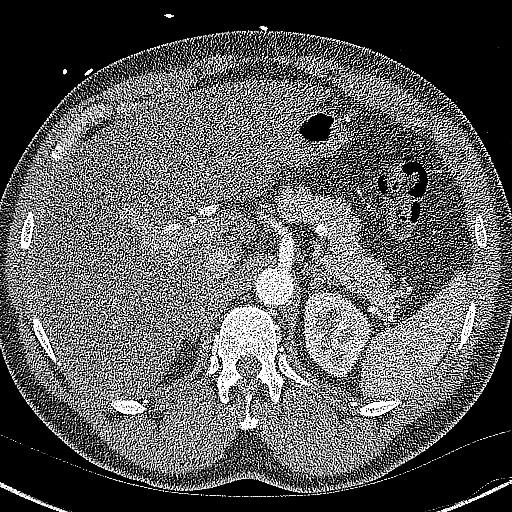
[im 37/170  soft-tissue]
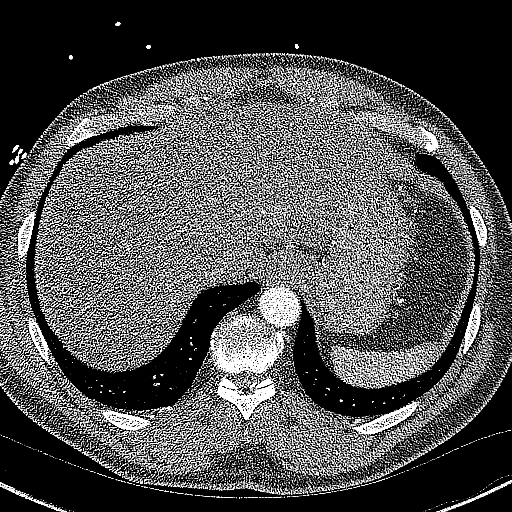
[im 61/170  soft-tissue]
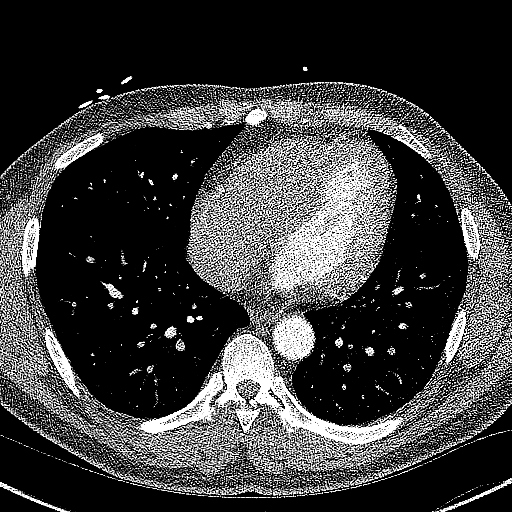
[im 73/170  soft-tissue]
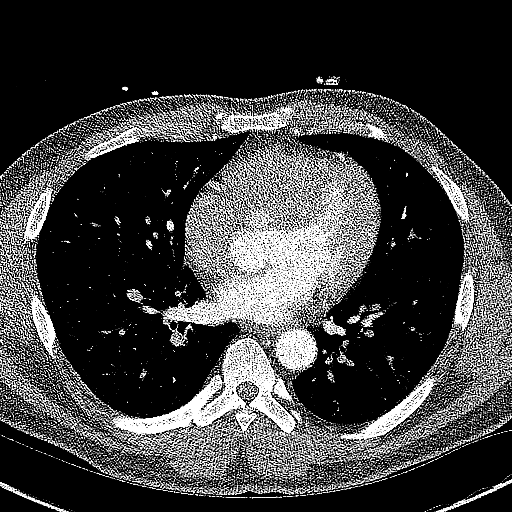
[im 97/170  soft-tissue]
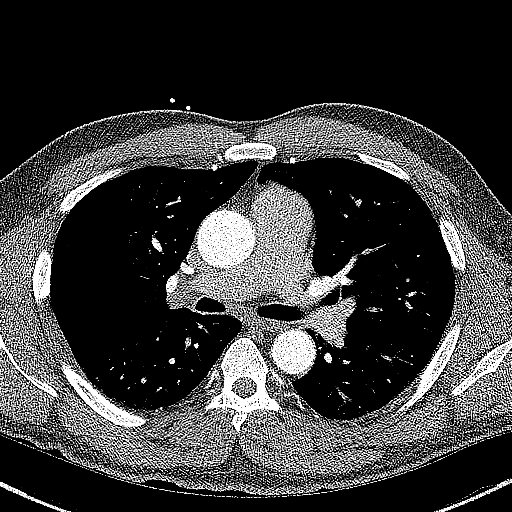
[im 109/170  soft-tissue]
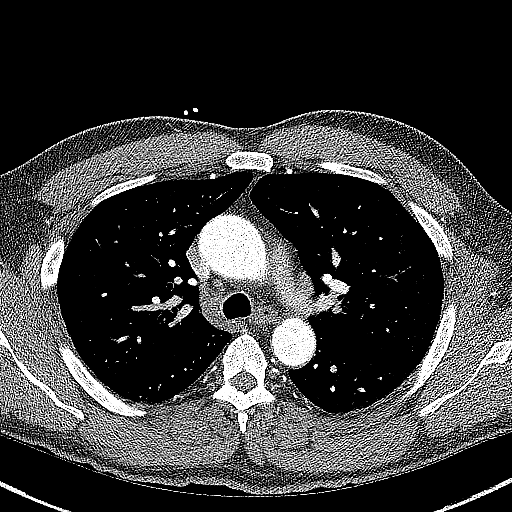
[im 133/170  soft-tissue]
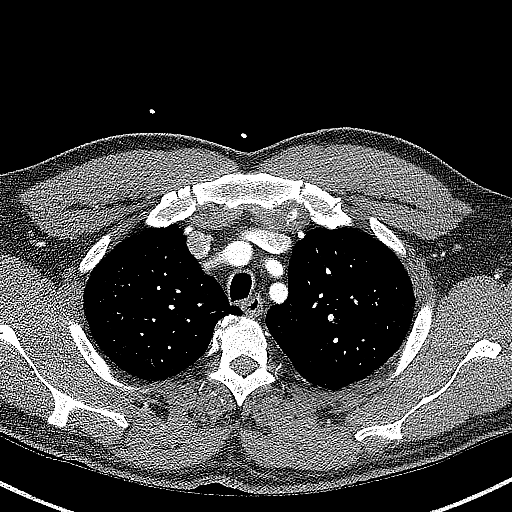

[Series 8: coronals · coronal · 0.66mm/px · 3 of 123 slices shown]
[im 31/123  soft-tissue]
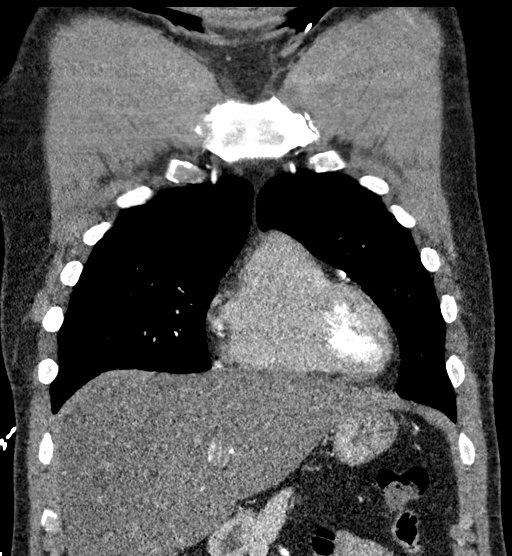
[im 62/123  soft-tissue]
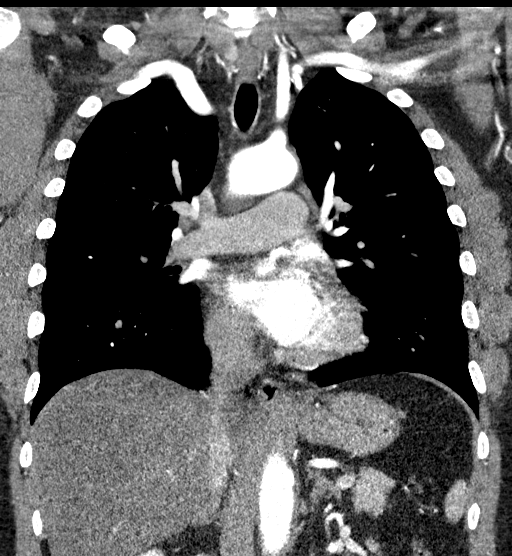
[im 92/123  soft-tissue]
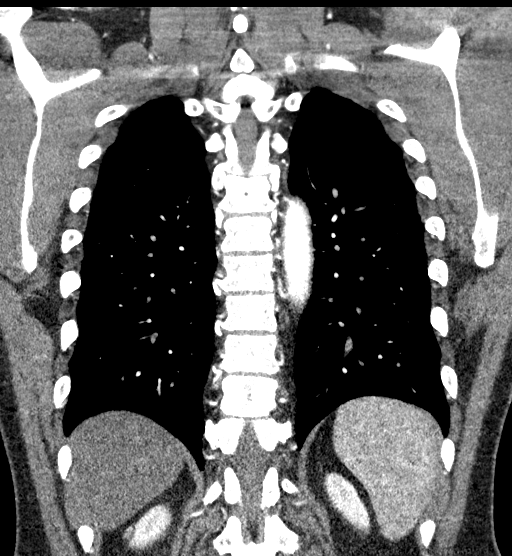

[19 of 46 positions shown; findings below may reference images not displayed]

FINDINGS: Cardiovascular: Non contrasted images of the chest demonstrate no
intramural hematoma. Scattered aortic atherosclerosis. No dissection
seen. Mild ectasia of the ascending aorta up to 3.6 cm. No aneurysm.
Heart size within normal limits. No pericardial effusion.

Mediastinum/Nodes: No enlarged mediastinal, hilar, or axillary lymph
nodes. Thyroid gland, trachea, and esophagus demonstrate no
significant findings.

Lungs/Pleura: Lungs are clear. No pleural effusion or pneumothorax.

Upper Abdomen: No acute abnormality. Possible cortical scarring
midpole right kidney.

Musculoskeletal: No chest wall abnormality. No acute or significant
osseous findings.

Review of the MIP images confirms the above findings.
IMPRESSION: 1. Negative for aneurysm or aortic dissection.
2. Clear lung fields

Aortic Atherosclerosis ([id number]).

## 2021-11-05 DIAGNOSIS — E785 Hyperlipidemia, unspecified: Secondary | ICD-10-CM | POA: Diagnosis not present

## 2021-11-05 DIAGNOSIS — M47816 Spondylosis without myelopathy or radiculopathy, lumbar region: Secondary | ICD-10-CM | POA: Diagnosis not present

## 2021-11-05 DIAGNOSIS — R531 Weakness: Secondary | ICD-10-CM | POA: Diagnosis not present

## 2021-11-05 DIAGNOSIS — M50122 Cervical disc disorder at C5-C6 level with radiculopathy: Secondary | ICD-10-CM | POA: Diagnosis not present

## 2021-11-05 DIAGNOSIS — I69354 Hemiplegia and hemiparesis following cerebral infarction affecting left non-dominant side: Secondary | ICD-10-CM | POA: Diagnosis not present

## 2021-11-05 DIAGNOSIS — Z7982 Long term (current) use of aspirin: Secondary | ICD-10-CM | POA: Diagnosis not present

## 2021-11-05 DIAGNOSIS — Z880 Allergy status to penicillin: Secondary | ICD-10-CM | POA: Diagnosis not present

## 2021-11-05 DIAGNOSIS — M47812 Spondylosis without myelopathy or radiculopathy, cervical region: Secondary | ICD-10-CM | POA: Diagnosis not present

## 2021-11-05 DIAGNOSIS — M47814 Spondylosis without myelopathy or radiculopathy, thoracic region: Secondary | ICD-10-CM | POA: Diagnosis not present

## 2021-11-05 DIAGNOSIS — M5003 Cervical disc disorder with myelopathy, cervicothoracic region: Secondary | ICD-10-CM | POA: Diagnosis not present

## 2021-11-05 DIAGNOSIS — M5000 Cervical disc disorder with myelopathy, unspecified cervical region: Secondary | ICD-10-CM | POA: Diagnosis not present

## 2021-11-05 DIAGNOSIS — I252 Old myocardial infarction: Secondary | ICD-10-CM | POA: Diagnosis not present

## 2021-11-05 DIAGNOSIS — I69320 Aphasia following cerebral infarction: Secondary | ICD-10-CM | POA: Diagnosis not present

## 2021-11-05 DIAGNOSIS — M50022 Cervical disc disorder at C5-C6 level with myelopathy: Secondary | ICD-10-CM | POA: Diagnosis not present

## 2021-11-05 DIAGNOSIS — Z8673 Personal history of transient ischemic attack (TIA), and cerebral infarction without residual deficits: Secondary | ICD-10-CM | POA: Diagnosis not present

## 2021-11-05 DIAGNOSIS — M6281 Muscle weakness (generalized): Secondary | ICD-10-CM | POA: Diagnosis not present

## 2021-11-05 DIAGNOSIS — I2510 Atherosclerotic heart disease of native coronary artery without angina pectoris: Secondary | ICD-10-CM | POA: Diagnosis not present

## 2021-11-05 DIAGNOSIS — M50021 Cervical disc disorder at C4-C5 level with myelopathy: Secondary | ICD-10-CM | POA: Diagnosis not present

## 2021-11-05 DIAGNOSIS — G9520 Unspecified cord compression: Secondary | ICD-10-CM | POA: Diagnosis not present

## 2021-11-05 DIAGNOSIS — R29898 Other symptoms and signs involving the musculoskeletal system: Secondary | ICD-10-CM | POA: Diagnosis not present

## 2021-11-05 DIAGNOSIS — I10 Essential (primary) hypertension: Secondary | ICD-10-CM | POA: Diagnosis not present

## 2021-11-05 DIAGNOSIS — M50221 Other cervical disc displacement at C4-C5 level: Secondary | ICD-10-CM | POA: Diagnosis not present

## 2021-11-06 DIAGNOSIS — M6281 Muscle weakness (generalized): Secondary | ICD-10-CM | POA: Diagnosis not present

## 2021-11-06 DIAGNOSIS — G9520 Unspecified cord compression: Secondary | ICD-10-CM | POA: Diagnosis not present

## 2021-11-06 DIAGNOSIS — M5003 Cervical disc disorder with myelopathy, cervicothoracic region: Secondary | ICD-10-CM | POA: Diagnosis not present

## 2021-11-06 DIAGNOSIS — M50221 Other cervical disc displacement at C4-C5 level: Secondary | ICD-10-CM | POA: Diagnosis not present

## 2021-11-06 DIAGNOSIS — M50122 Cervical disc disorder at C5-C6 level with radiculopathy: Secondary | ICD-10-CM | POA: Diagnosis not present
